# Patient Record
Sex: Male | Born: 1965 | Race: White | Hispanic: No | Marital: Married | Smoking: Never smoker
Health system: Southern US, Community
[De-identification: ages and names within clinical notes are randomized; demographics above are authoritative.]

## PROBLEM LIST (undated history)

## (undated) DIAGNOSIS — I10 Essential (primary) hypertension: Secondary | ICD-10-CM

## (undated) DIAGNOSIS — N200 Calculus of kidney: Secondary | ICD-10-CM

## (undated) DIAGNOSIS — E119 Type 2 diabetes mellitus without complications: Secondary | ICD-10-CM

## (undated) DIAGNOSIS — N201 Calculus of ureter: Secondary | ICD-10-CM

## (undated) HISTORY — DX: Calculus of ureter: N20.1

## (undated) HISTORY — DX: Essential (primary) hypertension: I10

## (undated) HISTORY — DX: Type 2 diabetes mellitus without complications: E11.9

---

## 2015-06-22 DIAGNOSIS — E119 Type 2 diabetes mellitus without complications: Secondary | ICD-10-CM | POA: Insufficient documentation

## 2015-06-22 HISTORY — DX: Type 2 diabetes mellitus without complications: E11.9

## 2015-06-24 ENCOUNTER — Ambulatory Visit
Admission: EM | Admit: 2015-06-24 | Discharge: 2015-06-26 | Disposition: A | Discharge status: Home / Self Care | Payer: Federal, State, Local not specified - PPO | Attending: Emergency Medicine | Admitting: Emergency Medicine

## 2015-06-24 ENCOUNTER — Encounter: Payer: Self-pay | Admitting: Emergency Medicine

## 2015-06-24 DIAGNOSIS — Z87442 Personal history of urinary calculi: Secondary | ICD-10-CM | POA: Diagnosis not present

## 2015-06-24 DIAGNOSIS — N201 Calculus of ureter: Secondary | ICD-10-CM | POA: Diagnosis present

## 2015-06-24 DIAGNOSIS — R109 Unspecified abdominal pain: Secondary | ICD-10-CM

## 2015-06-24 DIAGNOSIS — R110 Nausea: Secondary | ICD-10-CM | POA: Insufficient documentation

## 2015-06-24 DIAGNOSIS — D72829 Elevated white blood cell count, unspecified: Secondary | ICD-10-CM | POA: Insufficient documentation

## 2015-06-24 DIAGNOSIS — N132 Hydronephrosis with renal and ureteral calculous obstruction: Secondary | ICD-10-CM | POA: Diagnosis not present

## 2015-06-24 DIAGNOSIS — N200 Calculus of kidney: Secondary | ICD-10-CM | POA: Diagnosis present

## 2015-06-24 DIAGNOSIS — E119 Type 2 diabetes mellitus without complications: Secondary | ICD-10-CM | POA: Diagnosis not present

## 2015-06-24 DIAGNOSIS — R161 Splenomegaly, not elsewhere classified: Secondary | ICD-10-CM | POA: Insufficient documentation

## 2015-06-24 HISTORY — DX: Type 2 diabetes mellitus without complications: E11.9

## 2015-06-24 HISTORY — DX: Calculus of kidney: N20.0

## 2015-06-24 MED ORDER — ONDANSETRON HCL 4 MG/2ML IJ SOLN
INTRAMUSCULAR | Status: AC
  Administered 2015-06-24: 4 mg via INTRAVENOUS
  Filled 2015-06-24: qty 2

## 2015-06-24 MED ORDER — MORPHINE SULFATE (PF) 4 MG/ML IV SOLN
INTRAVENOUS | Status: AC
  Administered 2015-06-24: 4 mg via INTRAVENOUS
  Filled 2015-06-24: qty 1

## 2015-06-24 MED ORDER — ONDANSETRON HCL 4 MG/2ML IJ SOLN
4.0000 mg | Freq: Once | INTRAMUSCULAR | Status: AC
  Administered 2015-06-24: 4 mg via INTRAVENOUS

## 2015-06-24 MED ORDER — MORPHINE SULFATE (PF) 4 MG/ML IV SOLN
4.0000 mg | Freq: Once | INTRAVENOUS | Status: AC
  Administered 2015-06-24: 4 mg via INTRAVENOUS

## 2015-06-24 MED ORDER — SODIUM CHLORIDE 0.9 % IV BOLUS (SEPSIS)
1000.0000 mL | Freq: Once | INTRAVENOUS | Status: AC
  Administered 2015-06-24: 1000 mL via INTRAVENOUS

## 2015-06-24 NOTE — ED Notes (Signed)
Patient ambulatory to triage with steady gait, without difficulty or distress noted; pt reports right flank pain today radiating into right side abd and testicles accomp by nausea; st hx kidney stones; seen by PCP and UA was WNL

## 2015-06-24 NOTE — ED Provider Notes (Signed)
[facility] Emergency Department Provider Note  ____________________________________________  Time seen: 11:45 PM  I have reviewed the triage vital signs and the nursing notes.   HISTORY  Chief Complaint Flank Pain     HPI Nicholas Bentley is a 49 y.o. male presents with 10 out of 10 right flank pain that radiates to the right lower quadrant and groin acute onset yesterday which subsequently abated however return tonight approximately one hour ago. Patient states that he was seen by his primary care provider a urinalysis was performed yesterday which revealed normal results. Patient denies any hematuria no dysuria. However does admit to vomiting and nausea. Of note patient has a history of 2 previous kidney stones.     Past Medical History  Diagnosis Date  . Diabetes mellitus without complication   . Kidney stone     There are no active problems to display for this patient.   History reviewed. No pertinent past surgical history.  Current Outpatient Rx  Name  Route  Sig  Dispense  Refill  . metFORMIN (GLUCOPHAGE-XR) 750 MG 24 hr tablet   Oral   Take 750 mg by mouth daily with breakfast.           Allergies Review of patient's allergies indicates no known allergies.  No family history on file.  Social History Social History  Substance Use Topics  . Smoking status: Never Smoker   . Smokeless tobacco: None  . Alcohol Use: No    Review of Systems  Constitutional: Negative for fever. Eyes: Negative for visual changes. ENT: Negative for sore throat. Cardiovascular: Negative for chest pain. Respiratory: Negative for shortness of breath. Gastrointestinal: Positive for right flank pain and, vomiting Genitourinary: Negative for dysuria. Musculoskeletal: Negative for back pain. Skin: Negative for rash. Neurological: Negative for headaches, focal weakness or numbness.   10-point ROS otherwise  negative.  ____________________________________________   PHYSICAL EXAM:  VITAL SIGNS: ED Triage Vitals  Enc Vitals Group     BP 06/24/15 2310 154/73 mmHg     Pulse Rate 06/24/15 2310 104     Resp 06/24/15 2310 18     Temp 06/24/15 2310 97.9 F (36.6 C)     Temp Source 06/24/15 2310 Oral     SpO2 06/24/15 2310 100 %     Weight 06/24/15 2310 187 lb (84.823 kg)     Height 06/24/15 2310 5\' 10"  (1.778 m)     Head Cir --      Peak Flow --      Pain Score 06/24/15 2311 7     Pain Loc --      Pain Edu? --      Excl. in [place]? --      Constitutional: Alert and oriented. Apparent discomfort Eyes: Conjunctivae are normal. PERRL. Normal extraocular movements. ENT   Head: Normocephalic and atraumatic.   Nose: No congestion/rhinnorhea.   Mouth/Throat: Mucous membranes are moist.   Neck: No stridor. Cardiovascular: Normal rate, regular rhythm. Normal and symmetric distal pulses are present in all extremities. No murmurs, rubs, or gallops. Respiratory: Normal respiratory effort without tachypnea nor retractions. Breath sounds are clear and equal bilaterally. No wheezes/rales/rhonchi. Gastrointestinal: Soft and nontender. No distention. There is no CVA tenderness. Genitourinary: deferred Musculoskeletal: Nontender with normal range of motion in all extremities. No joint effusions.  No lower extremity tenderness nor edema. Neurologic:  Normal speech and language. No gross focal neurologic deficits are appreciated. Speech is normal.  Skin:  Skin is warm, dry and  intact. No rash noted. Psychiatric: Mood and affect are normal. Speech and behavior are normal. Patient exhibits appropriate insight and judgment.  ____________________________________________    LABS (pertinent positives/negatives)  Labs Reviewed  CBC - Abnormal; Notable for the following:    WBC 21.8 (*)    All other components within normal limits  COMPREHENSIVE METABOLIC PANEL - Abnormal; Notable for the  following:    Chloride 100 (*)    Glucose, Bld 154 (*)    BUN 25 (*)    Creatinine, Ser 1.41 (*)    AST 14 (*)    ALT 14 (*)    GFR calc non Af Amer 57 (*)    All other components within normal limits  URINALYSIS COMPLETEWITH MICROSCOPIC ([facility] ONLY) - Abnormal; Notable for the following:    Color, Urine YELLOW (*)    APPearance CLEAR (*)    Ketones, ur 1+ (*)    Hgb urine dipstick 2+ (*)    All other components within normal limits      RADIOLOGY CT Abdomen Pelvis W Contrast (Final result) Result time: 06/25/15 01:41:05   Final result by Rad Results In Interface (06/25/15 01:41:05)   Narrative:   CLINICAL DATA: Left lower quadrant pain for 2 weeks. Nausea and vomiting.  EXAM: CT ABDOMEN AND PELVIS WITH CONTRAST  TECHNIQUE: Multidetector CT imaging of the abdomen and pelvis was performed using the standard protocol following bolus administration of intravenous contrast.  CONTRAST: 111mL OMNIPAQUE IOHEXOL 300 MG/ML SOLN  COMPARISON: None.  FINDINGS: Lower chest: The included lung bases are clear.  Liver: No focal lesion.  Hepatobiliary: Clips in the gallbladder fossa postcholecystectomy. No biliary dilatation.  Pancreas: Normal.  Spleen: Normal.  Adrenal glands: No nodule.  Kidneys: Symmetric renal enhancement and excretion. No hydronephrosis. No focal renal abnormality.  Stomach/Bowel: Stomach is distended with enteric contrast. There are no dilated or thickened small bowel loops. Small volume of stool throughout the colon without colonic wall thickening. No significant diverticular disease or diverticulitis. The appendix is normal.  Vascular/Lymphatic: No retroperitoneal adenopathy. Abdominal aorta is normal in caliber. Mild atherosclerosis of the abdominal aorta and its branches without aneurysm.  Reproductive: The uterus is surgically absent. No adnexal mass.  Bladder: Near completely decompressed.  Other: No free air, free fluid, or  intra-abdominal fluid collection.  Musculoskeletal: There are no acute or suspicious osseous abnormalities.  IMPRESSION: No acute abnormality in the abdomen/pelvis. No diverticulosis or diverticulitis.   Electronically Signed By: Jeb Levering M.D. On: 06/25/2015 01:41         INITIAL IMPRESSION / ASSESSMENT AND PLAN / ED COURSE  Pertinent labs & imaging results that were available during my care of the patient were reviewed by me and considered in my medical decision making (see chart for details).  Patient discussed with Dr. Alyson Ingles following reviewing patient CT scan which revealed a 6 x 7 mm kidney stone. Leukocytosis white count of 21.8 and history of fever at home per patient's wife at bedside.  ____________________________________________   FINAL CLINICAL IMPRESSION(S) / ED DIAGNOSES  Final diagnoses:  Right flank pain  Right kidney stone      Gregor Hams, MD 06/25/15 [phone]

## 2015-06-25 ENCOUNTER — Encounter
Admission: EM | Disposition: A | Discharge status: Home / Self Care | Payer: Self-pay | Source: Home / Self Care | Attending: Emergency Medicine

## 2015-06-25 ENCOUNTER — Emergency Department: Payer: Federal, State, Local not specified - PPO | Admitting: Anesthesiology

## 2015-06-25 ENCOUNTER — Encounter: Payer: Self-pay | Admitting: Anesthesiology

## 2015-06-25 ENCOUNTER — Emergency Department: Payer: Federal, State, Local not specified - PPO

## 2015-06-25 DIAGNOSIS — N201 Calculus of ureter: Secondary | ICD-10-CM | POA: Diagnosis present

## 2015-06-25 HISTORY — DX: Calculus of ureter: N20.1

## 2015-06-25 HISTORY — PX: CYSTOSCOPY W/ URETERAL STENT PLACEMENT: SHX1429

## 2015-06-25 HISTORY — PX: CYSTOSCOPY W/ RETROGRADES: SHX1426

## 2015-06-25 LAB — BASIC METABOLIC PANEL
ANION GAP: 7 (ref 5–15)
BUN: 22 mg/dL — ABNORMAL HIGH (ref 6–20)
CALCIUM: 8.7 mg/dL — AB (ref 8.9–10.3)
CO2: 30 mmol/L (ref 22–32)
Chloride: 104 mmol/L (ref 101–111)
Creatinine, Ser: 1.24 mg/dL (ref 0.61–1.24)
GFR calc Af Amer: >60 mL/min (ref >60)
GLUCOSE: 149 mg/dL — AB (ref 65–99)
Potassium: 4 mmol/L (ref 3.5–5.1)
Sodium: 141 mmol/L (ref 135–145)

## 2015-06-25 LAB — COMPREHENSIVE METABOLIC PANEL
ALT: 14 U/L — ABNORMAL LOW (ref 17–63)
AST: 14 U/L — AB (ref 15–41)
Albumin: 4.1 g/dL (ref 3.5–5.0)
Alkaline Phosphatase: 51 U/L (ref 38–126)
Anion gap: 8 (ref 5–15)
BILIRUBIN TOTAL: 1.2 mg/dL (ref 0.3–1.2)
BUN: 25 mg/dL — AB (ref 6–20)
CALCIUM: 8.9 mg/dL (ref 8.9–10.3)
CO2: 27 mmol/L (ref 22–32)
Chloride: 100 mmol/L — ABNORMAL LOW (ref 101–111)
Creatinine, Ser: 1.41 mg/dL — ABNORMAL HIGH (ref 0.61–1.24)
GFR calc Af Amer: >60 mL/min (ref >60)
GFR, EST NON AFRICAN AMERICAN: 57 mL/min — AB (ref >60)
Glucose, Bld: 154 mg/dL — ABNORMAL HIGH (ref 65–99)
POTASSIUM: 4.2 mmol/L (ref 3.5–5.1)
Sodium: 135 mmol/L (ref 135–145)
TOTAL PROTEIN: 7.2 g/dL (ref 6.5–8.1)

## 2015-06-25 LAB — CBC
HCT: 38.4 % — ABNORMAL LOW (ref 40.0–52.0)
HEMATOCRIT: 41.4 % (ref 40.0–52.0)
Hemoglobin: 12.9 g/dL — ABNORMAL LOW (ref 13.0–18.0)
Hemoglobin: 14 g/dL (ref 13.0–18.0)
MCH: 28.3 pg (ref 26.0–34.0)
MCH: 28.5 pg (ref 26.0–34.0)
MCHC: 33.6 g/dL (ref 32.0–36.0)
MCHC: 33.9 g/dL (ref 32.0–36.0)
MCV: 84 fL (ref 80.0–100.0)
MCV: 84.1 fL (ref 80.0–100.0)
PLATELETS: 150 10*3/uL (ref 150–440)
Platelets: 167 10*3/uL (ref 150–440)
RBC: 4.57 MIL/uL (ref 4.40–5.90)
RBC: 4.92 MIL/uL (ref 4.40–5.90)
RDW: 13.1 % (ref 11.5–14.5)
RDW: 13.4 % (ref 11.5–14.5)
WBC: 18.4 10*3/uL — AB (ref 3.8–10.6)
WBC: 21.8 10*3/uL — AB (ref 3.8–10.6)

## 2015-06-25 LAB — URINALYSIS COMPLETE WITH MICROSCOPIC (ARMC ONLY)
BILIRUBIN URINE: NEGATIVE
Bacteria, UA: NONE SEEN
GLUCOSE, UA: NEGATIVE mg/dL
Leukocytes, UA: NEGATIVE
Nitrite: NEGATIVE
Protein, ur: NEGATIVE mg/dL
Specific Gravity, Urine: 1.016 (ref 1.005–1.030)
Squamous Epithelial / LPF: NONE SEEN
pH: 5 (ref 5.0–8.0)

## 2015-06-25 LAB — GLUCOSE, CAPILLARY
GLUCOSE-CAPILLARY: 116 mg/dL — AB (ref 65–99)
GLUCOSE-CAPILLARY: 108 mg/dL — AB (ref 65–99)
Glucose-Capillary: 150 mg/dL — ABNORMAL HIGH (ref 65–99)
Glucose-Capillary: 103 mg/dL — ABNORMAL HIGH (ref 65–99)

## 2015-06-25 SURGERY — CYSTOSCOPY, FLEXIBLE, WITH STENT REPLACEMENT
Anesthesia: General | Laterality: Right | Wound class: Clean Contaminated

## 2015-06-25 MED ORDER — FENTANYL CITRATE (PF) 100 MCG/2ML IJ SOLN: 25.0000 ug | INTRAMUSCULAR | Status: DC | PRN

## 2015-06-25 MED ORDER — SULFAMETHOXAZOLE-TRIMETHOPRIM 800-160 MG PO TABS: 1.0000 | ORAL_TABLET | Freq: Two times a day (BID) | ORAL | Status: DC

## 2015-06-25 MED ORDER — BISACODYL 10 MG RE SUPP: 10.0000 mg | Freq: Every day | RECTAL | Status: DC | PRN

## 2015-06-25 MED ORDER — SENNA 8.6 MG PO TABS
1.0000 | ORAL_TABLET | Freq: Two times a day (BID) | ORAL | Status: DC
  Filled 2015-06-25 (×2): qty 1

## 2015-06-25 MED ORDER — DEXTROSE 5 % IV SOLN
1.0000 g | Freq: Every day | INTRAVENOUS | Status: DC
  Administered 2015-06-25 – 2015-06-26 (×2): 1 g via INTRAVENOUS
  Filled 2015-06-25 (×3): qty 10

## 2015-06-25 MED ORDER — FENTANYL CITRATE (PF) 100 MCG/2ML IJ SOLN
INTRAMUSCULAR | Status: DC | PRN
  Administered 2015-06-25: 50 ug via INTRAVENOUS

## 2015-06-25 MED ORDER — OXYCODONE-ACETAMINOPHEN 5-325 MG PO TABS: 1.0000 | ORAL_TABLET | ORAL | Status: DC | PRN

## 2015-06-25 MED ORDER — ZOLPIDEM TARTRATE 5 MG PO TABS: 5.0000 mg | ORAL_TABLET | Freq: Every evening | ORAL | Status: DC | PRN

## 2015-06-25 MED ORDER — ACETAMINOPHEN 325 MG PO TABS: 650.0000 mg | ORAL_TABLET | ORAL | Status: DC | PRN

## 2015-06-25 MED ORDER — PROPOFOL 10 MG/ML IV BOLUS
INTRAVENOUS | Status: DC | PRN
  Administered 2015-06-25: 180 mg via INTRAVENOUS

## 2015-06-25 MED ORDER — BELLADONNA ALKALOIDS-OPIUM 16.2-60 MG RE SUPP: 1.0000 | Freq: Four times a day (QID) | RECTAL | Status: DC | PRN

## 2015-06-25 MED ORDER — MIDAZOLAM HCL 2 MG/2ML IJ SOLN
INTRAMUSCULAR | Status: DC | PRN
  Administered 2015-06-25: 1 mg via INTRAVENOUS

## 2015-06-25 MED ORDER — DIPHENHYDRAMINE HCL 12.5 MG/5ML PO ELIX: 12.5000 mg | ORAL_SOLUTION | Freq: Four times a day (QID) | ORAL | Status: DC | PRN

## 2015-06-25 MED ORDER — CEFAZOLIN SODIUM-DEXTROSE 2-3 GM-% IV SOLR
INTRAVENOUS | Status: DC | PRN
  Administered 2015-06-25: 2 g via INTRAVENOUS

## 2015-06-25 MED ORDER — HYDROMORPHONE HCL 1 MG/ML IJ SOLN
1.0000 mg | Freq: Once | INTRAMUSCULAR | Status: AC
  Administered 2015-06-25: 1 mg via INTRAVENOUS
  Filled 2015-06-25: qty 1

## 2015-06-25 MED ORDER — INSULIN ASPART 100 UNIT/ML ~~LOC~~ SOLN
0.0000 [IU] | Freq: Three times a day (TID) | SUBCUTANEOUS | Status: DC
  Administered 2015-06-25: 2 [IU] via SUBCUTANEOUS
  Filled 2015-06-25: qty 2

## 2015-06-25 MED ORDER — ONDANSETRON HCL 4 MG/2ML IJ SOLN: 4.0000 mg | INTRAMUSCULAR | Status: DC | PRN

## 2015-06-25 MED ORDER — SODIUM CHLORIDE 0.9 % IV SOLN
INTRAVENOUS | Status: DC
  Administered 2015-06-25: 10:00:00 via INTRAVENOUS

## 2015-06-25 MED ORDER — SODIUM CHLORIDE 0.9 % IV SOLN
INTRAVENOUS | Status: DC | PRN
  Administered 2015-06-25: 04:00:00 via INTRAVENOUS

## 2015-06-25 MED ORDER — HYDROMORPHONE HCL 1 MG/ML IJ SOLN: 0.5000 mg | INTRAMUSCULAR | Status: DC | PRN

## 2015-06-25 MED ORDER — SUCCINYLCHOLINE CHLORIDE 20 MG/ML IJ SOLN
INTRAMUSCULAR | Status: DC | PRN
  Administered 2015-06-25: 100 mg via INTRAVENOUS

## 2015-06-25 MED ORDER — MAGNESIUM CITRATE PO SOLN
1.0000 | Freq: Once | ORAL | Status: DC | PRN
  Filled 2015-06-25: qty 296

## 2015-06-25 MED ORDER — TAMSULOSIN HCL 0.4 MG PO CAPS: 0.4000 mg | ORAL_CAPSULE | Freq: Every day | ORAL | Status: DC

## 2015-06-25 MED ORDER — ONDANSETRON HCL 4 MG/2ML IJ SOLN
INTRAMUSCULAR | Status: DC | PRN
  Administered 2015-06-25: 4 mg via INTRAVENOUS

## 2015-06-25 MED ORDER — POLYETHYLENE GLYCOL 3350 17 G PO PACK: 17.0000 g | PACK | Freq: Every day | ORAL | Status: DC | PRN

## 2015-06-25 MED ORDER — DIPHENHYDRAMINE HCL 50 MG/ML IJ SOLN: 12.5000 mg | Freq: Four times a day (QID) | INTRAMUSCULAR | Status: DC | PRN

## 2015-06-25 MED ORDER — ONDANSETRON HCL 4 MG/2ML IJ SOLN: 4.0000 mg | Freq: Once | INTRAMUSCULAR | Status: DC | PRN

## 2015-06-25 MED ORDER — LIDOCAINE HCL (CARDIAC) 20 MG/ML IV SOLN
INTRAVENOUS | Status: DC | PRN
  Administered 2015-06-25: 30 mg via INTRAVENOUS

## 2015-06-25 SURGICAL SUPPLY — 24 items
BAG DRAIN CYSTO-URO LG1000N (MISCELLANEOUS) ×4 IMPLANT
CATH FOLEY 2WAY SIL 16X30 (CATHETERS) ×4 IMPLANT
CATH URETL 5X70 OPEN END (CATHETERS) ×4 IMPLANT
CONRAY 43 FOR UROLOGY 50M (MISCELLANEOUS) ×4 IMPLANT
GLOVE BIO SURGEON STRL SZ7 (GLOVE) ×4 IMPLANT
GLOVE BIO SURGEON STRL SZ8 (GLOVE) ×4 IMPLANT
GOWN STRL REUS W/ TWL LRG LVL3 (GOWN DISPOSABLE) ×2 IMPLANT
GOWN STRL REUS W/TWL LRG LVL3 (GOWN DISPOSABLE) ×2
GUIDEWIRE STR ZIPWIRE 035X150 (MISCELLANEOUS) ×4 IMPLANT
KIT RM TURNOVER CYSTO AR (KITS) ×4 IMPLANT
PACK CYSTO AR (MISCELLANEOUS) ×4 IMPLANT
PREP PVP WINGED SPONGE (MISCELLANEOUS) ×4 IMPLANT
SENSORWIRE 0.038 NOT ANGLED (WIRE) ×4
SET CYSTO W/LG BORE CLAMP LF (SET/KITS/TRAYS/PACK) ×4 IMPLANT
SOL .9 NS 3000ML IRR  AL (IV SOLUTION) ×2
SOL .9 NS 3000ML IRR UROMATIC (IV SOLUTION) ×2 IMPLANT
STENT URET 6FRX24 CONTOUR (STENTS) IMPLANT
STENT URET 6FRX26 CONTOUR (STENTS) ×4 IMPLANT
SURGILUBE 2OZ TUBE FLIPTOP (MISCELLANEOUS) ×4 IMPLANT
SYR 30ML LL (SYRINGE) ×4 IMPLANT
SYRINGE IRR TOOMEY STRL 70CC (SYRINGE) ×4 IMPLANT
TRAP SPECIMEN MUCOUS 40CC (MISCELLANEOUS) ×4 IMPLANT
WATER STERILE IRR 1000ML POUR (IV SOLUTION) ×4 IMPLANT
WIRE SENSOR 0.038 NOT ANGLED (WIRE) ×2 IMPLANT

## 2015-06-25 NOTE — Consult Note (Signed)
Urology Consult  Referring physician: Dr. Owens Shark  Reason for referral: right ureteral stone  Chief Complaint: right flank pain  History of Present Illness: Nicholas Bentley is a 49yo with a hx of nephrolithiasis and DMII who presents with a 1 day hx of severe sharp, intermittent, non-radiating right flank pain. He has associated nausea/vomiting. CT scan shows a 84m rigth mid ureteral stone with moderate hydronephrosis, perinephric stranding and an enlarge kidney concerning for pyelonephritis. He has bilateral lower pole small renal calculi. WBC count is 21.8. UA and micro are positive for blood and leukocytes. He had a fever of 100.4 at home, but is afebrile here.   Past Medical History  Diagnosis Date  . Diabetes mellitus without complication   . Kidney stone    History reviewed. No pertinent past surgical history.  Medications: I have reviewed the patient's current medications. Allergies: No Known Allergies  No family history on file. Social History:  reports that he has never smoked. He does not have any smokeless tobacco history on file. He reports that he does not drink alcohol. His drug history is not on file.  Review of Systems  Gastrointestinal: Positive for nausea and vomiting.  Genitourinary: Positive for dysuria, urgency, frequency and flank pain.  All other systems reviewed and are negative.   Physical Exam:  Vital signs in last 24 hours: Temp:  [97.9 F (36.6 C)] 97.9 F (36.6 C) (09/14 2310) Pulse Rate:  [89-104] 89 (09/15 0130) Resp:  [18] 18 (09/14 2310) BP: (154)/(73-89) 154/89 mmHg (09/15 0130) SpO2:  [95 %-100 %] 95 % (09/15 0130) Weight:  [84.823 kg (187 lb)] 84.823 kg (187 lb) (09/14 2310) Physical Exam  Constitutional: He is oriented to person, place, and time. He appears well-developed and well-nourished.  HENT:  Head: Normocephalic and atraumatic.  Eyes: EOM are normal. Pupils are equal, round, and reactive to light.  Neck: Normal range of motion. No  thyromegaly present.  Cardiovascular: Normal rate and regular rhythm.   Respiratory: Effort normal. No respiratory distress.  GI: Soft. He exhibits no distension.  Genitourinary: Penis normal.  Musculoskeletal: Normal range of motion.  Neurological: He is alert and oriented to person, place, and time.  Skin: Skin is warm and dry.  Psychiatric: He has a normal mood and affect. His behavior is normal. Judgment and thought content normal.    Laboratory Data:  Results for orders placed or performed during the hospital encounter of 06/24/15 (from the past 72 hour(s))  CBC     Status: Abnormal   Collection Time: 06/25/15 12:28 AM  Result Value Ref Range   WBC 21.8 (H) 3.8 - 10.6 K/uL   RBC 4.92 4.40 - 5.90 MIL/uL   Hemoglobin 14.0 13.0 - 18.0 g/dL   HCT 41.4 40.0 - 52.0 %   MCV 84.1 80.0 - 100.0 fL   MCH 28.5 26.0 - 34.0 pg   MCHC 33.9 32.0 - 36.0 g/dL   RDW 13.1 11.5 - 14.5 %   Platelets 167 150 - 440 K/uL  Comprehensive metabolic panel     Status: Abnormal   Collection Time: 06/25/15 12:28 AM  Result Value Ref Range   Sodium 135 135 - 145 mmol/Bentley   Potassium 4.2 3.5 - 5.1 mmol/Bentley   Chloride 100 (Bentley) 101 - 111 mmol/Bentley   CO2 27 22 - 32 mmol/Bentley   Glucose, Bld 154 (H) 65 - 99 mg/dL   BUN 25 (H) 6 - 20 mg/dL   Creatinine, Ser 1.41 (H) 0.61 - 1.24 mg/dL  Calcium 8.9 8.9 - 10.3 mg/dL   Total Protein 7.2 6.5 - 8.1 g/dL   Albumin 4.1 3.5 - 5.0 g/dL   AST 14 (Bentley) 15 - 41 U/Bentley   ALT 14 (Bentley) 17 - 63 U/Bentley   Alkaline Phosphatase 51 38 - 126 U/Bentley   Total Bilirubin 1.2 0.3 - 1.2 mg/dL   GFR calc non Af Amer 57 (Bentley) >60 mL/min   GFR calc Af Amer >60 >60 mL/min    Comment: (NOTE) The eGFR has been calculated using the CKD EPI equation. This calculation has not been validated in all clinical situations. eGFR's persistently <60 mL/min signify possible Chronic Kidney Disease.    Anion gap 8 5 - 15  Urinalysis complete, with microscopic ([facility] only)     Status: Abnormal   Collection Time: 06/25/15  12:28 AM  Result Value Ref Range   Color, Urine YELLOW (A) YELLOW   APPearance CLEAR (A) CLEAR   Glucose, UA NEGATIVE NEGATIVE mg/dL   Bilirubin Urine NEGATIVE NEGATIVE   Ketones, ur 1+ (A) NEGATIVE mg/dL   Specific Gravity, Urine 1.016 1.005 - 1.030   Hgb urine dipstick 2+ (A) NEGATIVE   pH 5.0 5.0 - 8.0   Protein, ur NEGATIVE NEGATIVE mg/dL   Nitrite NEGATIVE NEGATIVE   Leukocytes, UA NEGATIVE NEGATIVE   RBC / HPF 6-30 0 - 5 RBC/hpf   WBC, UA 0-5 0 - 5 WBC/hpf   Bacteria, UA NONE SEEN NONE SEEN   Squamous Epithelial / LPF NONE SEEN NONE SEEN   Mucous PRESENT    No results found for this or any previous visit (from the past 240 hour(s)). Creatinine:  Recent Labs  06/25/15 0028  CREATININE 1.41*   Baseline Creatinine: unkown  Impression/Assessment:  49yo with a right ureteral calculus and leukocytosis  Plan:  The risks/benefits/alternatives to right ureteral stent placement was explained to the patient and he understands and wishes to proceed with surgery. Pt will be taken urgently to OR for stent placement and then will be admitted for observation following stent placement.   Nicholas Bentley 06/25/2015, 2:30 AM

## 2015-06-25 NOTE — Brief Op Note (Signed)
06/24/2015 - 06/25/2015  4:46 AM  PATIENT:  Nicholas Bentley  49 y.o. male  PRE-OPERATIVE DIAGNOSIS:  stones  POST-OPERATIVE DIAGNOSIS:  right uretral stone obstruction  PROCEDURE:  Procedure(s): CYSTOSCOPY WITH STENT PLACEMENT (Right) CYSTOSCOPY WITH RETROGRADE PYELOGRAM  SURGEON:  Surgeon(s) and Role:    * Cleon Gustin, MD - Primary  PHYSICIAN ASSISTANT:   ASSISTANTS: none   ANESTHESIA:   general  EBL:     BLOOD ADMINISTERED:none  DRAINS: Urinary Catheter (Foley) , right 6x26 JJ ureteral stent without tether  LOCAL MEDICATIONS USED:  NONE  SPECIMEN:  Source of Specimen:  right renal pelvis  DISPOSITION OF SPECIMEN:  N/A  COUNTS:  YES  TOURNIQUET:  * No tourniquets in log *  DICTATION: .Note written in EPIC  PLAN OF CARE: Admit for overnight observation  PATIENT DISPOSITION:  PACU - hemodynamically stable.   Delay start of Pharmacological VTE agent (>24hrs) due to surgical blood loss or risk of bleeding: not applicable

## 2015-06-25 NOTE — Progress Notes (Signed)
No events since this morning Doing well No f/c/n/v  Filed Vitals:   06/25/15 0612 06/25/15 0643 06/25/15 0716 06/25/15 1342  BP: 139/78 135/76 123/75 135/75  Pulse: 96 96 88 92  Temp: 97.7 F (36.5 C) 98.2 F (36.8 C) 97.8 F (36.6 C) 98.8 F (37.1 C)  TempSrc: Oral Oral Oral Oral  Resp: 16 18 18 18   Height:      Weight: 183 lb 15.9 oz (83.458 kg)     SpO2: 95% 97% 99% 99%   NAD Soft nt nd No foley  POD 0 cysto, r stent. doing well -foley out -diet -abx -repeat cbc -home in am if doing well

## 2015-06-25 NOTE — Op Note (Signed)
.  Preoperative diagnosis: right UPJ stone, leukocytosis  Postoperative diagnosis: Same  Procedure: 1 cystoscopy 2. right retrograde pyelography 3.  Intraoperative fluoroscopy, under one hour, with interpretation 4. right 6 x 26 JJ stent placement  Attending: Nicolette Bang  Anesthesia: General  Estimated blood loss: None  Drains: Right 6 x 26 JJ ureteral stent without tether, 16 French foley catheter  Specimens: right renal pelvis urine for culture  Antibiotics: ancef  Findings:right mid ureteral stone stone. Moderate hydronephrosis. No masses/lesions in the bladder. Ureteral orifices in normal anatomic location.  Indications: Patient is a 49 year old male with a history of right renal stone and concern for sepsis.  After discussing treatment options, they decided proceed with right stent placement.  Procedure her in detail: The patient was brought to the operating room and a brief timeout was done to ensure correct patient, correct procedure, correct site.  General anesthesia was administered patient was placed in dorsal lithotomy position.  Their genitalia was then prepped and draped in usual sterile fashion.  A rigid 75 French cystoscope was passed in the urethra and the bladder.  Bladder was inspected free masses or lesions.  the ureteral orifices were in the normal orthotopic locations.  a 6 french ureteral catheter was then instilled into the right ureteral orifice. We advanced the catheter past the stone with the aid of a sensor wire. We then obtained a urine specimen and sent it for culture.  a gentle retrograde was obtained and findings noted above.  we then placed a sensor wire through the ureteral catheter and advanced up to the renal pelvis.    We then placed a 6 x 26 double-j ureteral stent over the original zip wire.  We then removed the wire and good coil was noted in the the renal pelvis under fluoroscopy and the bladder under direct vision.  A foley catheter was then  placed. the bladder was then drained and this concluded the procedure which was well tolerated by patient.  Complications: None  Condition: Stable, extubated, transferred to PACU  Plan: Patient is to be admitted for IV antibiotics. He will have his stone extraction in 2 weeks.

## 2015-06-25 NOTE — Anesthesia Procedure Notes (Signed)
Procedure Name: Intubation Date/Time: 06/25/2015 4:22 AM Performed by: Lendon Colonel Pre-anesthesia Checklist: Patient identified, Emergency Drugs available, Suction available, Patient being monitored and Timeout performed Patient Re-evaluated:Patient Re-evaluated prior to inductionOxygen Delivery Method: Circle system utilized Preoxygenation: Pre-oxygenation with 100% oxygen Intubation Type: IV induction, Cricoid Pressure applied and Rapid sequence Laryngoscope Size: Miller and 2 Grade View: Grade I Tube type: Oral Number of attempts: 1 Airway Equipment and Method: Stylet Placement Confirmation: ETT inserted through vocal cords under direct vision,  positive ETCO2 and breath sounds checked- equal and bilateral Secured at: 21 cm Tube secured with: Tape Dental Injury: Teeth and Oropharynx as per pre-operative assessment

## 2015-06-25 NOTE — Transfer of Care (Signed)
Immediate Anesthesia Transfer of Care Note  Patient: [name], retail  Procedure(s) Performed: Procedure(s): CYSTOSCOPY WITH STENT PLACEMENT (Right) CYSTOSCOPY WITH RETROGRADE PYELOGRAM  Patient Location: PACU  Anesthesia Type:General  Level of Consciousness: awake, alert , oriented and patient cooperative  Airway & Oxygen Therapy: Patient Spontanous Breathing and Patient connected to face mask oxygen  Post-op Assessment: Report given to RN and Post -op Vital signs reviewed and stable  Post vital signs: Reviewed and stable  Last Vitals:  Filed Vitals:   06/25/15 0314  BP: 148/89  Pulse: 89  Temp:   Resp: 18    Complications: No apparent anesthesia complications

## 2015-06-25 NOTE — Anesthesia Preprocedure Evaluation (Signed)
Anesthesia Evaluation  Patient identified by MRN, date of birth, ID band Patient awake    Reviewed: Allergy & Precautions, NPO status , Patient's Chart, lab work & pertinent test results  Airway Mallampati: II  TM Distance: >3 FB Neck ROM: Full    Dental no notable dental hx.    Pulmonary neg pulmonary ROS,    Pulmonary exam normal breath sounds clear to auscultation       Cardiovascular negative cardio ROS Normal cardiovascular exam     Neuro/Psych negative neurological ROS  negative psych ROS   GI/Hepatic negative GI ROS,   Endo/Other  diabetes, Type 2, Oral Hypoglycemic Agents  Renal/GU Kidney stones  negative genitourinary   Musculoskeletal negative musculoskeletal ROS (+)   Abdominal Normal abdominal exam  (+)   Peds negative pediatric ROS (+)  Hematology negative hematology ROS (+)   Anesthesia Other Findings   Reproductive/Obstetrics                             Anesthesia Physical Anesthesia Plan  ASA: II and emergent  Anesthesia Plan: General   Post-op Pain Management:    Induction: Intravenous, Rapid sequence and Cricoid pressure planned  Airway Management Planned: Oral ETT  Additional Equipment:   Intra-op Plan:   Post-operative Plan: Extubation in OR  Informed Consent: I have reviewed the patients History and Physical, chart, labs and discussed the procedure including the risks, benefits and alternatives for the proposed anesthesia with the patient or authorized representative who has indicated his/her understanding and acceptance.   Dental advisory given  Plan Discussed with: CRNA and Surgeon  Anesthesia Plan Comments:         Anesthesia Quick Evaluation

## 2015-06-26 ENCOUNTER — Telehealth: Payer: Self-pay | Admitting: Radiology

## 2015-06-26 LAB — CBC
HEMATOCRIT: 37.3 % — AB (ref 40.0–52.0)
HEMOGLOBIN: 12.8 g/dL — AB (ref 13.0–18.0)
MCH: 28.6 pg (ref 26.0–34.0)
MCHC: 34.2 g/dL (ref 32.0–36.0)
MCV: 83.5 fL (ref 80.0–100.0)
Platelets: 160 10*3/uL (ref 150–440)
RBC: 4.47 MIL/uL (ref 4.40–5.90)
RDW: 12.9 % (ref 11.5–14.5)
WBC: 10 10*3/uL (ref 3.8–10.6)

## 2015-06-26 LAB — GLUCOSE, CAPILLARY: GLUCOSE-CAPILLARY: 119 mg/dL — AB (ref 65–99)

## 2015-06-26 NOTE — Discharge Instructions (Signed)
Patient may shower, activities unlimited may work should drink 2 quarts of water daily

## 2015-06-26 NOTE — Progress Notes (Signed)
Alert and oriented. Vss. No signs of acute distress. Voiding. No pain reported. Tolerating diet. Discharge instructions given. Patient verbalized understanding.

## 2015-06-26 NOTE — Telephone Encounter (Signed)
Pt notified of surgery scheduled 07/13/15. Pt to call day before for arrival time. Pt verbalized understanding. Pt asked to change pre-admit testing appt to a phone interview since he has recently had surgery.  Will call pt with new appt.

## 2015-06-26 NOTE — Discharge Summary (Signed)
Patient admitted with sepsis and a midureteral calculus. He was stented on his day of admission which was benign 15. Today and 916 not septic lungs are clear to auscultation heart sounds regular rate and rhythm abdomen soft no discomfort. He feels he can take Tylenol at home for discomfort. Dr. Rosie Fate placed a right ureteral stent in this patient. I reviewed his x-rays and he has a right thumb ureteral calculus at the level of about L3. It's not quite in his UPJ. The plan is to treat him with outpatient antibiotics in the form of Ceftin 250 mg twice a day until his stone is removed with ureteroscopy and laser lithotripsy in the near future. Probably wait about 10 days before operating him. I went through the surgery with the patient and his wife. I answered all her questions. His cultures have not come back yet. I'll review them in the office and if there is any need for change in his antibiotic medication I will do. Follow-up is in my office in about a week for preoperative orders.

## 2015-06-27 LAB — URINE CULTURE: CULTURE: NO GROWTH

## 2015-06-29 ENCOUNTER — Telehealth: Payer: Self-pay | Admitting: Radiology

## 2015-06-29 NOTE — Telephone Encounter (Signed)
Pt's wife called to clarify instructions.  Clarified that surgery is scheduled 07/13/15, pre-admit testing phone interview is scheduled for 07/07/15, pt should call on Sept 30 to verify arrival time for surgery. Verbalizes understanding.

## 2015-06-30 NOTE — Anesthesia Postprocedure Evaluation (Signed)
  Anesthesia Post-op Note  Patient: [name], retail  Procedure(s) Performed: Procedure(s): CYSTOSCOPY WITH STENT PLACEMENT (Right) CYSTOSCOPY WITH RETROGRADE PYELOGRAM  Anesthesia type:General  Patient location: PACU  Post pain: Pain level controlled  Post assessment: Post-op Vital signs reviewed, Patient's Cardiovascular Status Stable, Respiratory Function Stable, Patent Airway and No signs of Nausea or vomiting  Post vital signs: Reviewed and stable  Last Vitals:  Filed Vitals:   06/26/15 0458  BP: 146/83  Pulse: 85  Temp: 36.8 C  Resp: 18    Level of consciousness: awake, alert  and patient cooperative  Complications: No apparent anesthesia complications

## 2015-07-03 ENCOUNTER — Encounter: Payer: Self-pay | Admitting: Obstetrics and Gynecology

## 2015-07-03 ENCOUNTER — Ambulatory Visit (INDEPENDENT_AMBULATORY_CARE_PROVIDER_SITE_OTHER): Payer: Federal, State, Local not specified - PPO | Admitting: Obstetrics and Gynecology

## 2015-07-03 ENCOUNTER — Ambulatory Visit: Payer: Self-pay | Admitting: Obstetrics and Gynecology

## 2015-07-03 VITALS — BP 138/80 | HR 80 | Resp 16 | Ht 70.0 in | Wt 187.4 lb

## 2015-07-03 DIAGNOSIS — N200 Calculus of kidney: Secondary | ICD-10-CM | POA: Diagnosis not present

## 2015-07-03 LAB — URINALYSIS, COMPLETE
BILIRUBIN UA: NEGATIVE
Glucose, UA: NEGATIVE
KETONES UA: NEGATIVE
Nitrite, UA: NEGATIVE
SPEC GRAV UA: 1.025 (ref 1.005–1.030)
Urobilinogen, Ur: 0.2 mg/dL (ref 0.2–1.0)
pH, UA: 5.5 (ref 5.0–7.5)

## 2015-07-03 LAB — MICROSCOPIC EXAMINATION
RBC, UA: >30 /hpf — AB (ref 0–?)
Renal Epithel, UA: NONE SEEN /hpf

## 2015-07-03 NOTE — Progress Notes (Signed)
07/03/2015 11:40 AM   Marliss Czar Mar 01, 1966 [id number]  Referring provider: No referring provider defined for this encounter.  Chief Complaint  Patient presents with  . Pre-op Exam  . Nephrolithiasis    HPI: 49yo with a hx of nephrolithiasis and DMII who presented to the ED on  with a 1 day hx of severe sharp, intermittent, non-radiating right flank pain. He has associated nausea/vomiting. CT scan shows a 29mm rigth mid ureteral stone with moderate hydronephrosis, perinephric stranding and an enlarge kidney concerning for pyelonephritis. He has bilateral lower pole small renal calculi. He was found to have elevated WBCs and ultimately a left ureteral stent was placed by Dr. Alyson Ingles on 06/25/15. He was discharged on Bactrim pending urine culture results which ultimately showed no growth.   Patient presents today for preop appointment for definitive treatment of right ureteral stone.  These reports feeling much better since stent was placed. He denies any fevers.  PMH: Past Medical History  Diagnosis Date  . Diabetes mellitus without complication   . Kidney stone     Surgical History: Past Surgical History  Procedure Laterality Date  . Cystoscopy w/ ureteral stent placement Right 06/25/2015    Procedure: CYSTOSCOPY WITH STENT PLACEMENT;  Surgeon: Cleon Gustin, MD;  Location: [facility] ORS;  Service: Urology;  Laterality: Right;  . Cystoscopy w/ retrogrades  06/25/2015    Procedure: CYSTOSCOPY WITH RETROGRADE PYELOGRAM;  Surgeon: Cleon Gustin, MD;  Location: [facility] OR[facility];  Service: Urology;;    Home Medications:    Medication List       This list is accurate as of: 07/03/15 11:40 AM.  Always use your most recent med list.               metFORMIN 750 MG 24 hr tablet  Commonly known as:  GLUCOPHAGE-XR  Take 750 mg by mouth daily with breakfast.     ONE TOUCH ULTRA TEST test strip  Generic drug:  glucose blood  USE 2 (TWO) TIMES DAILY.     ONETOUCH DELICA  LANCETS 93Z Misc  USE 1 EACH 2 (TWO) TIMES DAILY. TO CHECK BLOOD SUGARS DAILY     oxyCODONE-acetaminophen 5-325 MG per tablet  Commonly known as:  ROXICET  Take 1 tablet by mouth every 4 (four) hours as needed for severe pain.     sulfamethoxazole-trimethoprim 800-160 MG per tablet  Commonly known as:  BACTRIM DS,SEPTRA DS  Take 1 tablet by mouth 2 (two) times daily.     tamsulosin 0.4 MG Caps capsule  Commonly known as:  FLOMAX  Take 1 capsule (0.4 mg total) by mouth daily after supper.        Allergies: No Known Allergies  Family History: Family History  Problem Relation Age of Onset  . Diabetes Father   . Diabetes Paternal Grandfather     Social History:  reports that he has never smoked. He does not have any smokeless tobacco history on file. He reports that he does not drink alcohol. His drug history is not on file.  ROS: UROLOGY Frequent Urination?: Yes Hard to postpone urination?: No Burning/pain with urination?: No Get up at night to urinate?: Yes Leakage of urine?: No Urine stream starts and stops?: No Trouble starting stream?: No Do you have to strain to urinate?: No Blood in urine?: No Urinary tract infection?: No Sexually transmitted disease?: No Injury to kidneys or bladder?: No Painful intercourse?: No Weak stream?: No Erection problems?: No Penile pain?: No  Gastrointestinal Nausea?: No  Vomiting?: No Indigestion/heartburn?: No Diarrhea?: No Constipation?: No  Constitutional Fever: No Night sweats?: No Weight loss?: No Fatigue?: No  Skin Skin rash/lesions?: No Itching?: No  Eyes Blurred vision?: No Double vision?: No  Ears/Nose/Throat Sore throat?: No Sinus problems?: No  Hematologic/Lymphatic Swollen glands?: No Easy bruising?: No  Cardiovascular Leg swelling?: No Chest pain?: No  Respiratory Cough?: No Shortness of breath?: No  Endocrine Excessive thirst?: No  Musculoskeletal Back pain?: No Joint pain?:  No  Neurological Headaches?: No Dizziness?: No  Psychologic Depression?: No Anxiety?: No  Physical Exam: BP 138/80 mmHg  Pulse 80  Resp 16  Ht 5\' 10"  (1.778 m)  Wt 187 lb 6.4 oz (85.004 kg)  BMI 26.89 kg/m2  Constitutional:  Alert and oriented, No acute distress. HEENT: [place] AT, moist mucus membranes.  Trachea midline, no masses. Cardiovascular: No clubbing, cyanosis, or edema, RRR Respiratory: Normal respiratory effort, no increased work of breathing, CTAB GI: Abdomen is soft, nontender, nondistended, no abdominal masses GU: mild right CVA tenderness. Skin: No rashes, bruises or suspicious lesions. Lymph: No cervical or inguinal adenopathy. Neurologic: Grossly intact, no focal deficits, moving all 4 extremities. Psychiatric: Normal mood and affect.  Laboratory Data: Lab Results  Component Value Date   WBC 10.0 06/26/2015   HGB 12.8* 06/26/2015   HCT 37.3* 06/26/2015   MCV 83.5 06/26/2015   PLT 160 06/26/2015    Lab Results  Component Value Date   CREATININE 1.24 06/25/2015    No results found for: PSA  No results found for: TESTOSTERONE  No results found for: HGBA1C  Urinalysis    Component Value Date/Time   COLORURINE YELLOW* 06/25/2015 0028   APPEARANCEUR CLEAR* 06/25/2015 0028   LABSPEC 1.016 06/25/2015 0028   PHURINE 5.0 06/25/2015 0028   GLUCOSEU NEGATIVE 06/25/2015 0028   HGBUR 2+* 06/25/2015 0028   BILIRUBINUR NEGATIVE 06/25/2015 0028   KETONESUR 1+* 06/25/2015 0028   PROTEINUR NEGATIVE 06/25/2015 0028   NITRITE NEGATIVE 06/25/2015 0028   LEUKOCYTESUR NEGATIVE 06/25/2015 0028    Pertinent Imaging: CLINICAL DATA: Right flank pain, onset yesterday, radiating into the groin. Nausea. History of kidney stones.  EXAM: CT ABDOMEN AND PELVIS WITHOUT CONTRAST  TECHNIQUE: Multidetector CT imaging of the abdomen and pelvis was performed following the standard protocol without IV contrast.  COMPARISON: None.  FINDINGS: The included lung  bases are clear.  There is a 6 x 7 mm stone in the right mid ureter with moderate proximal hydroureteronephrosis and mild perinephric stranding. Stone is at the level of L4. The ureter distal to this is decompressed. There are no additional nonobstructing right renal stones. There are 2 nonobstructing stones in the lower left kidney. Mild prominence of the left proximal ureter, the left distal ureter is decompressed. No ureteral stones. There is mild nonspecific stranding about the left kidney.  Evaluation of the remaining solid and hollow viscera is limited given lack of contrast. The unenhanced liver, gallbladder, adrenal glands, and pancreas are normal. Mild splenomegaly, spleen measures 14 cm in greatest dimension.  The stomach is decompressed. There are no dilated or thickened bowel loops. Small to moderate volume of colonic stool. The appendix is normal.  Mild mesenteric haziness. Small amount of fluid in the pelvis. This measures simple fluid density.  No retroperitoneal adenopathy. Abdominal aorta is normal in caliber. Mild atherosclerosis of the abdominal aorta without aneurysm.  Within the pelvis the bladder is physiologically distended without stone. Prostate gland is prominent. Calcification of the vas deferens. There is no pelvic adenopathy.  There are no acute or suspicious osseous abnormalities. There are scattered bone islands.  IMPRESSION: 1. Obstructing 6 x 7 mm stone in the right mid ureter with resultant hydroureteronephrosis. 2. Nonobstructing stones in the left kidney. 3. Incidental findings include mild splenomegaly. Small amount of free fluid in the pelvis is likely reactive, however nonspecific.  Assessment & Plan:    1. Nephrolithiasis- Obstructing 6 x 7 mm stone in the right mid ureter with resultant Hydroureteronephrosis. S/p stent placement. Plan for right URS with laser lithotripsy and stent placement for definitive management of  stone.  The risks of URS/LL/ureteral stent placement are explained to the patient, such as: stent pain (the patient is informed that about 50% of patients who undergo ureteroscopy and have a stent will have "stent pain," and this is by far the most common risk/complaint following ureteroscopy. A stent is a soft plastic tube (about half the size of IV tubing) that allows the kidney to drain to the bladder regardless of edema or obstruction. Not only can the stent "rub" on the inside of the bladder, causing a feeling of needing to urinate/overactive bladder, but also the stent allows urine to pass up from the bladder to the kidney during urination - causing symptoms from a warm, tingling sensation to intense pain in the affected flank), residual stones within the kidney or ureter may be present up to 40% of the time following ureteroscopy, depending on the original stone size and location and may need another procedure to rid the patient of stone burden, injury to the ureter is the most common intra-operative complication during ureteroscopy. The reported risk of perforation ranges greatly, depending on whether it is defined as a complete perforation (0.1-0.7% - think of this as a hole through the entire ureter), a partial perforation (1.6% - a hole nearly through the entire ureter), or mucosal tear/scrape (5% - these are similar to a sore on the inside of the mouth). Almost 100% of these will heal with prolonged stenting (anywhere between 2 - 4 weeks). Should a large perforation occur, your urologist may chose to stop the procedure and return on another day when the ureter has had time to heal. - Urinalysis, Complete - CULTURE, URINE COMPREHENSIVE   Return in about 4 weeks (around 07/31/2015) for after surgery to review RUS.  Herbert Moors, [facility] [address] [place], [place] [address] [phone]

## 2015-07-06 LAB — CULTURE, URINE COMPREHENSIVE

## 2015-07-07 ENCOUNTER — Other Ambulatory Visit: Payer: Federal, State, Local not specified - PPO

## 2015-07-07 ENCOUNTER — Encounter: Payer: Self-pay | Admitting: *Deleted

## 2015-07-07 NOTE — Patient Instructions (Addendum)
  Your procedure is scheduled on: 07-13-15 Report to [facility] To find out your arrival time please call [phone] between 1PM - 3PM on 07-10-15  Remember: Instructions that are not followed completely may result in serious medical risk, up to and including death, or upon the discretion of your surgeon and anesthesiologist your surgery may need to be rescheduled.    __X__ 1. Do not eat food or drink liquids after midnight. No gum chewing or hard candies.     __X__ 2. No Alcohol for 24 hours before or after surgery.   ____ 3. Bring all medications with you on the day of surgery if instructed.    ____ 4. Notify your doctor if there is any change in your medical condition     (cold, fever, infections).     Do not wear jewelry, make-up, hairpins, clips or nail polish.  Do not wear lotions, powders, or perfumes. You may wear deodorant.  Do not shave 48 hours prior to surgery. Men may shave face and neck.  Do not bring valuables to the hospital.    [facility] is not responsible for any belongings or valuables.               Contacts, dentures or bridgework may not be worn into surgery.  Leave your suitcase in the car. After surgery it may be brought to your room.  For patients admitted to the hospital, discharge time is determined by your  treatment team.   Patients discharged the day of surgery will not be allowed to drive home.   Please read over the following fact sheets that you were given:    ____ Take these medicines the morning of surgery with A SIP OF WATER:    1. NONE  2.   3.   4.  5.  6.  ____ Fleet Enema (as directed)   ____ Use CHG Soap as directed  ____ Use inhalers on the day of surgery  _X___ Stop metformin 2 days prior to surgery-LAST DOSE 07-10-15 (FRIDAY)    ____ Take 1/2 of usual insulin dose the night before surgery and none on the morning of surgery.   ____ Stop Coumadin/Plavix/aspirin-N/A  ____ Stop  Anti-inflammatories-NO NSAIDS OR ASA PRODUCTS-TYLENOL OK   ____ Stop supplements until after surgery.    ____ Bring C-Pap to the hospital.

## 2015-07-09 ENCOUNTER — Telehealth: Payer: Self-pay | Admitting: Radiology

## 2015-07-09 ENCOUNTER — Other Ambulatory Visit: Payer: Self-pay

## 2015-07-09 DIAGNOSIS — N200 Calculus of kidney: Secondary | ICD-10-CM

## 2015-07-09 NOTE — Telephone Encounter (Signed)
Pt's wife states she was told they would receive a call to schedule a renal US. There doesn't appear to be one ordered. Please advise.

## 2015-07-13 ENCOUNTER — Encounter
Admission: RE | Disposition: A | Discharge status: Home / Self Care | Payer: Self-pay | Source: Ambulatory Visit | Attending: Urology

## 2015-07-13 ENCOUNTER — Ambulatory Visit: Payer: Federal, State, Local not specified - PPO | Admitting: Certified Registered Nurse Anesthetist

## 2015-07-13 ENCOUNTER — Ambulatory Visit
Admission: RE | Admit: 2015-07-13 | Discharge: 2015-07-13 | Disposition: A | Discharge status: Home / Self Care | Payer: Federal, State, Local not specified - PPO | Source: Ambulatory Visit | Attending: Urology | Admitting: Urology

## 2015-07-13 ENCOUNTER — Encounter: Payer: Self-pay | Admitting: *Deleted

## 2015-07-13 DIAGNOSIS — Z7984 Long term (current) use of oral hypoglycemic drugs: Secondary | ICD-10-CM | POA: Insufficient documentation

## 2015-07-13 DIAGNOSIS — E119 Type 2 diabetes mellitus without complications: Secondary | ICD-10-CM | POA: Insufficient documentation

## 2015-07-13 DIAGNOSIS — Z79891 Long term (current) use of opiate analgesic: Secondary | ICD-10-CM | POA: Insufficient documentation

## 2015-07-13 DIAGNOSIS — N201 Calculus of ureter: Secondary | ICD-10-CM | POA: Diagnosis not present

## 2015-07-13 DIAGNOSIS — Z79899 Other long term (current) drug therapy: Secondary | ICD-10-CM | POA: Diagnosis not present

## 2015-07-13 DIAGNOSIS — Z87442 Personal history of urinary calculi: Secondary | ICD-10-CM | POA: Insufficient documentation

## 2015-07-13 DIAGNOSIS — Z466 Encounter for fitting and adjustment of urinary device: Secondary | ICD-10-CM | POA: Diagnosis not present

## 2015-07-13 HISTORY — PX: CYSTOSCOPY WITH STENT PLACEMENT: SHX5790

## 2015-07-13 HISTORY — PX: URETEROSCOPY WITH HOLMIUM LASER LITHOTRIPSY: SHX6645

## 2015-07-13 LAB — GLUCOSE, CAPILLARY
Glucose-Capillary: 118 mg/dL — ABNORMAL HIGH (ref 65–99)
Glucose-Capillary: 142 mg/dL — ABNORMAL HIGH (ref 65–99)

## 2015-07-13 SURGERY — CYSTOSCOPY, WITH STENT INSERTION
Anesthesia: General | Laterality: Right | Wound class: Clean Contaminated

## 2015-07-13 MED ORDER — BUPIVACAINE HCL 0.5 % IJ SOLN
INTRAMUSCULAR | Status: DC | PRN
  Administered 2015-07-13: 30 mL

## 2015-07-13 MED ORDER — SODIUM CHLORIDE 0.9 % IV SOLN
INTRAVENOUS | Status: DC
  Administered 2015-07-13: 09:00:00 via INTRAVENOUS

## 2015-07-13 MED ORDER — FENTANYL CITRATE (PF) 100 MCG/2ML IJ SOLN
INTRAMUSCULAR | Status: DC | PRN
  Administered 2015-07-13 (×4): 25 ug via INTRAVENOUS

## 2015-07-13 MED ORDER — BELLADONNA ALKALOIDS-OPIUM 16.2-60 MG RE SUPP
RECTAL | Status: AC
  Filled 2015-07-13: qty 1

## 2015-07-13 MED ORDER — ONDANSETRON HCL 4 MG/2ML IJ SOLN: 4.0000 mg | Freq: Once | INTRAMUSCULAR | Status: DC | PRN

## 2015-07-13 MED ORDER — FAMOTIDINE 20 MG PO TABS
ORAL_TABLET | ORAL | Status: AC
  Filled 2015-07-13: qty 1

## 2015-07-13 MED ORDER — DEXTROSE 5 % IV SOLN
1.0000 g | Freq: Once | INTRAVENOUS | Status: AC
  Administered 2015-07-13: 1 g via INTRAVENOUS
  Filled 2015-07-13: qty 10

## 2015-07-13 MED ORDER — FAMOTIDINE 20 MG PO TABS: 20.0000 mg | ORAL_TABLET | Freq: Once | ORAL | Status: DC

## 2015-07-13 MED ORDER — HYDROMORPHONE HCL 1 MG/ML IJ SOLN: 0.2500 mg | INTRAMUSCULAR | Status: DC | PRN

## 2015-07-13 MED ORDER — PROPOFOL 10 MG/ML IV BOLUS
INTRAVENOUS | Status: DC | PRN
  Administered 2015-07-13: 200 mg via INTRAVENOUS

## 2015-07-13 MED ORDER — ONDANSETRON HCL 4 MG/2ML IJ SOLN
INTRAMUSCULAR | Status: DC | PRN
  Administered 2015-07-13: 4 mg via INTRAVENOUS

## 2015-07-13 MED ORDER — GLYCOPYRROLATE 0.2 MG/ML IJ SOLN
INTRAMUSCULAR | Status: DC | PRN
Start: 2015-07-13 — End: 2015-07-13
  Administered 2015-07-13: 0.2 mg via INTRAVENOUS

## 2015-07-13 MED ORDER — OXYCODONE-ACETAMINOPHEN 5-325 MG PO TABS: 1.0000 | ORAL_TABLET | ORAL | Status: DC | PRN

## 2015-07-13 MED ORDER — BELLADONNA ALKALOIDS-OPIUM 16.2-60 MG RE SUPP
RECTAL | Status: DC | PRN
  Administered 2015-07-13: 1 via RECTAL

## 2015-07-13 MED ORDER — LIDOCAINE HCL (CARDIAC) 20 MG/ML IV SOLN
INTRAVENOUS | Status: DC | PRN
  Administered 2015-07-13: 60 mg via INTRAVENOUS

## 2015-07-13 MED ORDER — MIDAZOLAM HCL 2 MG/2ML IJ SOLN
INTRAMUSCULAR | Status: DC | PRN
  Administered 2015-07-13: 2 mg via INTRAVENOUS

## 2015-07-13 MED ORDER — BUPIVACAINE HCL (PF) 0.5 % IJ SOLN
INTRAMUSCULAR | Status: AC
  Filled 2015-07-13: qty 30

## 2015-07-13 SURGICAL SUPPLY — 28 items
BAG DRAIN CYSTO-URO LG1000N (MISCELLANEOUS) ×2 IMPLANT
CATH URETL 5X70 OPEN END (CATHETERS) IMPLANT
CNTNR SPEC 2.5X3XGRAD LEK (MISCELLANEOUS)
CONRAY 43 FOR UROLOGY 50M (MISCELLANEOUS) ×2 IMPLANT
CONT SPEC 4OZ STER OR WHT (MISCELLANEOUS)
CONTAINER SPEC 2.5X3XGRAD LEK (MISCELLANEOUS) IMPLANT
GLOVE BIO SURGEON STRL SZ7 (GLOVE) ×4 IMPLANT
GLOVE BIO SURGEON STRL SZ7.5 (GLOVE) ×2 IMPLANT
GOWN STRL REUS W/ TWL LRG LVL3 (GOWN DISPOSABLE) ×1 IMPLANT
GOWN STRL REUS W/ TWL XL LVL3 (GOWN DISPOSABLE) ×1 IMPLANT
GOWN STRL REUS W/TWL LRG LVL3 (GOWN DISPOSABLE) ×1
GOWN STRL REUS W/TWL XL LVL3 (GOWN DISPOSABLE) ×1
GUIDEWIRE STR ZIPWIRE 035X150 (MISCELLANEOUS) ×2 IMPLANT
INTRODUCER DILATOR DOUBLE (INTRODUCER) IMPLANT
KIT RM TURNOVER CYSTO AR (KITS) ×2 IMPLANT
LASER HOLMIUM PROCEDURE (MISCELLANEOUS) ×2 IMPLANT
PACK CYSTO AR (MISCELLANEOUS) ×2 IMPLANT
PREP PVP WINGED SPONGE (MISCELLANEOUS) ×2 IMPLANT
SENSORWIRE 0.038 NOT ANGLED (WIRE) ×2
SET CYSTO W/LG BORE CLAMP LF (SET/KITS/TRAYS/PACK) ×2 IMPLANT
SHEATH URETERAL 13/15X36 1L (SHEATH) IMPLANT
SOL .9 NS 3000ML IRR  AL (IV SOLUTION) ×1
SOL .9 NS 3000ML IRR UROMATIC (IV SOLUTION) ×1 IMPLANT
SOL PREP PVP 2OZ (MISCELLANEOUS) ×2
SOLUTION PREP PVP 2OZ (MISCELLANEOUS) ×1 IMPLANT
SURGILUBE 2OZ TUBE FLIPTOP (MISCELLANEOUS) ×2 IMPLANT
WATER STERILE IRR 1000ML POUR (IV SOLUTION) ×2 IMPLANT
WIRE SENSOR 0.038 NOT ANGLED (WIRE) ×1 IMPLANT

## 2015-07-13 NOTE — Op Note (Signed)
Preop ureteral calculous Postop same Procedure  Cysto, right ureteroscopy stent removal Anes: general Surgeon: Collier Flowers DO With the patient sterile draped,in the supine lithotomy position for ease of approach to the external genitalia we begin the procedure.  A time-out is taken and then with a 21FR Cystoscope shealth we ender the bladder.  30 degree lens is utilized. The stent is seen and removed tpo the distal urethra. A 35 Fr sensor wire is placed up the ureter to the kidney.  Fluroscopic guidance is utilized. A wolf 7 Fr scope is placed up the ureter  I  get beyond a scarred narrowing of the mid ureter.and with a 360 micron homium laser fiber I disintigrate the calcuous. No stent is necessary. The wire is withdrawn. the bladder is emptied thru the cystoscope sheath.  14ml of 0.5% marcaine is put in the bladder and sheath is withdrawn.    A60mg  Belladonna and opium suppository is placed in the rectum.  There a normal rectal exam is completed.  The bladder itself showed no turmors, masses or calculi  The patient was sent to the recovery room in satisfactory condition.

## 2015-07-13 NOTE — H&P (Signed)
Right sided calculous mid ureter. Plan ureteroscopy and laser lithotripsy.  HS RRR without murmur.  Lungs CTA

## 2015-07-13 NOTE — Transfer of Care (Signed)
Immediate Anesthesia Transfer of Care Note  Patient: [name], retail  Procedure(s) Performed: Procedure(s): CYSTOSCOPY WITH STENT REMOVAL  (Right) URETEROSCOPY WITH HOLMIUM LASER LITHOTRIPSY (Right)  Patient Location: PACU  Anesthesia Type:General  Level of Consciousness: sedated  Airway & Oxygen Therapy: Patient Spontanous Breathing and Patient connected to face mask oxygen  Post-op Assessment: Report given to RN and Post -op Vital signs reviewed and stable  Post vital signs: Reviewed and stable  Last Vitals:  Filed Vitals:   07/13/15 0948  BP:   Pulse: 68  Temp: 36.4 C  Resp: 9    Complications: No apparent anesthesia complications

## 2015-07-13 NOTE — Discharge Instructions (Signed)
° °                                                      Ureteroscopy ° °A Ureteroscopy is an examination of the upper urinary tract, performed with a ureteroscope that is passed through the urethra, the bladder, and then directly into the ureter. It is performed to find the cause of urine blockage in a ureter, perform a biopsy or identify and evaluate other abnormalities inside the ureters or kidneys. It is useful in the diagnosis and treatment of disorders such as kidney stones, ureteral strictures or other abnormalities of the ureter. Smaller stones in the bladder or lower ureter can be removed in one piece, while bigger ones are usually broken before removal during ureteroscopy. ° °*After your ureteroscopy, your doctor may need to place a stent in a ureter to drain urine from the kidney to the bladder while swelling in the ureter goes away. The stent may cause some discomfort to your kidney and ureter. The discomfort is generally mild. The stent may be left in for a week or more. You will be instructed to either remove the stent yourself at home by pulling a string protruding from your urethra or to return to our office for removal by your doctor. ° °After a ureteroscopy you may  °have a mild burning feeling when urinating °see small amounts of blood in the urine °have mild discomfort in the bladder area or kidney area when urinating °need to urinate more frequently or urgently °*These problems should not last more than 24 hours unless a ureteral stent was placed. ° °If a stent was placed symptoms symptoms may continue until it is removed. You should notify your doctor right away if bleeding or pain is severe. ° °To prevent or relieve discomfort it can be helpful to °drink 16 ounces of water each hour for 2 hours after the procedure °take a warm bath to relieve the burning feeling °hold a warm, damp washcloth over the urethral opening to relieve discomfort °take an over-the-counter pain reliever ° °Please notify  our office if you experience any of the following symptoms °burning with urination lasting more than 24hrs °Fever greater than 100F or present directly to emergency department °abdominal or flank pain °very bloody, cloudy or foul smelling urine ° °*If you have any additional questions or concerns please call our office at [phone] ° °[facility] °[address], [address] °[place], [place] [address] °[phone] ° ° ° ° ° °

## 2015-07-13 NOTE — Anesthesia Preprocedure Evaluation (Addendum)
Anesthesia Evaluation  Patient identified by MRN, date of birth, ID band Patient awake    Reviewed: Allergy & Precautions, NPO status , Patient's Chart, lab work & pertinent test results  Airway Mallampati: I  TM Distance: >3 FB Neck ROM: Full    Dental  (+) Teeth Intact   Pulmonary    Pulmonary exam normal        Cardiovascular Exercise Tolerance: Good Normal cardiovascular exam     Neuro/Psych    GI/Hepatic   Endo/Other  diabetes, Well Controlled, Type 2BG 118.  Renal/GU Stones.     Musculoskeletal   Abdominal (+)  Abdomen: soft.    Peds  Hematology   Anesthesia Other Findings   Reproductive/Obstetrics                            Anesthesia Physical Anesthesia Plan  ASA: III  Anesthesia Plan: General   Post-op Pain Management:    Induction: Intravenous  Airway Management Planned: LMA  Additional Equipment:   Intra-op Plan:   Post-operative Plan: Extubation in OR  Informed Consent: I have reviewed the patients History and Physical, chart, labs and discussed the procedure including the risks, benefits and alternatives for the proposed anesthesia with the patient or authorized representative who has indicated his/her understanding and acceptance.     Plan Discussed with: CRNA  Anesthesia Plan Comments:         Anesthesia Quick Evaluation

## 2015-07-13 NOTE — Anesthesia Postprocedure Evaluation (Signed)
  Anesthesia Post-op Note  Patient: [name], retail  Procedure(s) Performed: Procedure(s): CYSTOSCOPY WITH STENT REMOVAL  (Right) URETEROSCOPY WITH HOLMIUM LASER LITHOTRIPSY (Right)  Anesthesia type:General  Patient location: PACU  Post pain: Pain level controlled  Post assessment: Post-op Vital signs reviewed, Patient's Cardiovascular Status Stable, Respiratory Function Stable, Patent Airway and No signs of Nausea or vomiting  Post vital signs: Reviewed and stable  Last Vitals:  Filed Vitals:   07/13/15 0948  BP:   Pulse: 68  Temp: 36.4 C  Resp: 9    Level of consciousness: awake, alert  and patient cooperative  Complications: No apparent anesthesia complications

## 2015-07-29 ENCOUNTER — Ambulatory Visit (INDEPENDENT_AMBULATORY_CARE_PROVIDER_SITE_OTHER): Payer: Federal, State, Local not specified - PPO | Admitting: Urology

## 2015-07-29 ENCOUNTER — Encounter: Payer: Self-pay | Admitting: Urology

## 2015-07-29 VITALS — BP 129/82 | HR 73 | Ht 70.0 in | Wt 193.7 lb

## 2015-07-29 DIAGNOSIS — N521 Erectile dysfunction due to diseases classified elsewhere: Secondary | ICD-10-CM | POA: Diagnosis not present

## 2015-07-29 DIAGNOSIS — N201 Calculus of ureter: Secondary | ICD-10-CM | POA: Diagnosis not present

## 2015-07-29 LAB — URINALYSIS, COMPLETE
BILIRUBIN UA: NEGATIVE
GLUCOSE, UA: NEGATIVE
Ketones, UA: NEGATIVE
Leukocytes, UA: NEGATIVE
NITRITE UA: NEGATIVE
PH UA: 5.5 (ref 5.0–7.5)
PROTEIN UA: NEGATIVE
RBC UA: NEGATIVE
Specific Gravity, UA: 1.02 (ref 1.005–1.030)
UUROB: 0.2 mg/dL (ref 0.2–1.0)

## 2015-07-29 LAB — MICROSCOPIC EXAMINATION
Bacteria, UA: NONE SEEN
Epithelial Cells (non renal): NONE SEEN /hpf (ref 0–10)
RBC MICROSCOPIC, UA: NONE SEEN /HPF (ref 0–?)
RENAL EPITHEL UA: NONE SEEN /HPF
WBC UA: NONE SEEN /HPF (ref 0–?)

## 2015-07-29 NOTE — Progress Notes (Signed)
Patient has had a successful ureteroscopy and removal of calculus disintegration of same in the mid ureter. I did not leave a stent in. He had no post op pain or spasms on the right side. He has several small calculi on the left in the renal collecting system. These will be followed with a KUB at 8 months. The present time am going to obtain a 24-hour urine for calculi analysis from a metabolic standpoint. I discussed this with he and his wife. Secondarily the patient is diabetic had recent problems with his erections. He is having difficulty holding his erections for a long enough time to have an orgasm. So I'm going to give him some Viagra or Cialis samples today and come back in a month. At that time we should know the results of his 24-hour urine.

## 2015-08-07 ENCOUNTER — Ambulatory Visit
Admission: RE | Admit: 2015-08-07 | Discharge: 2015-08-07 | Disposition: A | Discharge status: Home / Self Care | Payer: Federal, State, Local not specified - PPO | Source: Ambulatory Visit | Attending: Obstetrics and Gynecology | Admitting: Obstetrics and Gynecology

## 2015-08-07 DIAGNOSIS — N200 Calculus of kidney: Secondary | ICD-10-CM | POA: Diagnosis present

## 2015-08-10 ENCOUNTER — Ambulatory Visit (INDEPENDENT_AMBULATORY_CARE_PROVIDER_SITE_OTHER): Payer: Federal, State, Local not specified - PPO | Admitting: Obstetrics and Gynecology

## 2015-08-10 ENCOUNTER — Encounter: Payer: Self-pay | Admitting: Obstetrics and Gynecology

## 2015-08-10 VITALS — BP 124/78 | HR 81 | Resp 16 | Ht 70.0 in | Wt 195.0 lb

## 2015-08-10 DIAGNOSIS — N200 Calculus of kidney: Secondary | ICD-10-CM

## 2015-08-10 LAB — URINALYSIS, COMPLETE
Bilirubin, UA: NEGATIVE
GLUCOSE, UA: NEGATIVE
KETONES UA: NEGATIVE
Leukocytes, UA: NEGATIVE
NITRITE UA: NEGATIVE
Protein, UA: NEGATIVE
SPEC GRAV UA: 1.025 (ref 1.005–1.030)
UUROB: 0.2 mg/dL (ref 0.2–1.0)
pH, UA: 5.5 (ref 5.0–7.5)

## 2015-08-10 LAB — MICROSCOPIC EXAMINATION
Bacteria, UA: NONE SEEN
EPITHELIAL CELLS (NON RENAL): NONE SEEN /HPF (ref 0–10)
Renal Epithel, UA: NONE SEEN /hpf
WBC UA: NONE SEEN /HPF (ref 0–?)

## 2015-08-10 MED ORDER — TAMSULOSIN HCL 0.4 MG PO CAPS: 0.4000 mg | ORAL_CAPSULE | Freq: Every day | ORAL | Status: DC

## 2015-08-10 NOTE — Progress Notes (Signed)
08/10/2015 11:30 AM   Nicholas Bentley 1965/10/15 [id number]  Referring provider: Juluis Pitch, MD [place], [place] [address]  Chief Complaint  Patient presents with  . Nephrolithiasis    4 wk post-op    HPI: Patient is 4 weeks s/p right URS with laser lithotripsy on 07/13/15.  Patient's post operative course has been uneventful.  He reports feeling well today.  He presents today to review his RUS results.  Previous History:  49yo with a hx of nephrolithiasis and DMII who presented to the ED on with a 1 day hx of severe sharp, intermittent, non-radiating right flank pain. He has associated nausea/vomiting. CT scan shows a 34m rigth mid ureteral stone with moderate hydronephrosis, perinephric stranding and an enlarge kidney concerning for pyelonephritis. He has bilateral lower pole small renal calculi. He was found to have elevated WBCs and ultimately a left ureteral stent was placed by Dr. MAlyson Ingleson 06/25/15. He was discharged on Bactrim pending urine culture results which ultimately showed no growth.  Patient presents today for preop appointment for definitive treatment of right ureteral stone. These reports feeling much better since stent was placed. He denies any fevers.    PMH: Past Medical History  Diagnosis Date  . Diabetes mellitus without complication ([place]   . Kidney stone   . Controlled diabetes mellitus type II without complication ([place] 97/79/3903 . Ureteral calculi 06/25/2015    Surgical History: Past Surgical History  Procedure Laterality Date  . Cystoscopy w/ ureteral stent placement Right 06/25/2015    Procedure: CYSTOSCOPY WITH STENT PLACEMENT;  Surgeon: PCleon Gustin MD;  Location: [facility] ORS;  Service: Urology;  Laterality: Right;  . Cystoscopy w/ retrogrades  06/25/2015    Procedure: CYSTOSCOPY WITH RETROGRADE PYELOGRAM;  Surgeon: PCleon Gustin MD;  Location: [facility] ORS;  Service:  Urology;;  . Cystoscopy with stent placement Right 07/13/2015    Procedure: CYSTOSCOPY WITH STENT REMOVAL ;  Surgeon: RCollier Flowers MD;  Location: [facility] ORS;  Service: Urology;  Laterality: Right;  . Ureteroscopy with holmium laser lithotripsy Right 07/13/2015    Procedure: URETEROSCOPY WITH HOLMIUM LASER LITHOTRIPSY;  Surgeon: RCollier Flowers MD;  Location: [facility] ORS;  Service: Urology;  Laterality: Right;    Home Medications:    Medication List       This list is accurate as of: 08/10/15 11:30 AM.  Always use your most recent med list.               acetaminophen 325 MG tablet  Commonly known as:  TYLENOL  Take 650 mg by mouth every 6 (six) hours as needed.     metFORMIN 750 MG 24 hr tablet  Commonly known as:  GLUCOPHAGE-XR  Take 750 mg by mouth daily with breakfast.     ONE TOUCH ULTRA TEST test strip  Generic drug:  glucose blood  USE 2 (TWO) TIMES DAILY.     ONETOUCH DELICA LANCETS 300PMisc  USE 1 EACH 2 (TWO) TIMES DAILY. TO CHECK BLOOD SUGARS DAILY     oxyCODONE-acetaminophen 5-325 MG tablet  Commonly known as:  ROXICET  Take 1 tablet by mouth every 4 (four) hours as needed for severe pain.     tamsulosin 0.4 MG Caps capsule  Commonly known as:  FLOMAX  Take 1 capsule (0.4 mg total) by mouth daily.        Allergies: No Known Allergies  Family History: Family History  Problem  Relation Age of Onset  . Diabetes Father   . Diabetes Paternal Grandfather   . Prostate cancer Neg Hx   . Bladder Cancer Neg Hx   . Renal cancer Neg Hx     Social History:  reports that he has never smoked. He does not have any smokeless tobacco history on file. He reports that he does not drink alcohol. His drug history is not on file.  ROS: UROLOGY Frequent Urination?: No Hard to postpone urination?: No Burning/pain with urination?: No Get up at night to urinate?: No Leakage of urine?: No Urine stream starts and stops?: No Trouble starting stream?: No Do you have to  strain to urinate?: No Blood in urine?: No Urinary tract infection?: No Sexually transmitted disease?: No Injury to kidneys or bladder?: No Painful intercourse?: No Weak stream?: No Erection problems?: No Penile pain?: No  Gastrointestinal Nausea?: No Vomiting?: No Indigestion/heartburn?: No Diarrhea?: No Constipation?: No  Constitutional Fever: No Night sweats?: No Weight loss?: No Fatigue?: No  Skin Skin rash/lesions?: No Itching?: No  Eyes Blurred vision?: No Double vision?: No  Ears/Nose/Throat Sore throat?: No Sinus problems?: No  Hematologic/Lymphatic Swollen glands?: No Easy bruising?: No  Cardiovascular Leg swelling?: No Chest pain?: No  Respiratory Cough?: No Shortness of breath?: No  Endocrine Excessive thirst?: No  Musculoskeletal Back pain?: No Joint pain?: No  Neurological Headaches?: No Dizziness?: No  Psychologic Depression?: No Anxiety?: No  Physical Exam: BP 124/78 mmHg  Pulse 81  Resp 16  Ht '5\' 10"'  (1.778 m)  Wt 195 lb (88.451 kg)  BMI 27.98 kg/m2  Constitutional:  Alert and oriented, No acute distress. HEENT: [place] AT, moist mucus membranes.  Trachea midline, no masses. Cardiovascular: No clubbing, cyanosis, or edema. Respiratory: Normal respiratory effort, no increased work of breathing. Skin: No rashes, bruises or suspicious lesions. Lymph: No cervical adenopathy. Neurologic: Grossly intact, no focal deficits, moving all 4 extremities. Psychiatric: Normal mood and affect.  Laboratory Data:   Urinalysis    Component Value Date/Time   COLORURINE YELLOW* 06/25/2015 0028   APPEARANCEUR CLEAR* 06/25/2015 0028   LABSPEC 1.016 06/25/2015 0028   PHURINE 5.0 06/25/2015 0028   GLUCOSEU Negative 07/29/2015 1135   HGBUR 2+* 06/25/2015 0028   BILIRUBINUR Negative 07/29/2015 1135   BILIRUBINUR NEGATIVE 06/25/2015 0028   KETONESUR 1+* 06/25/2015 0028   PROTEINUR NEGATIVE 06/25/2015 0028   NITRITE Negative 07/29/2015 1135    NITRITE NEGATIVE 06/25/2015 0028   LEUKOCYTESUR Negative 07/29/2015 1135   LEUKOCYTESUR NEGATIVE 06/25/2015 0028    Pertinent Imaging:   Assessment & Plan:    1. Nephrolithiasis- Dr. Elnoria Howard mentioned ordering a 24hr urinalysis in his previous note.  Patient states that he never received a kit.  [facility] reordered today. RUS showing no hydronephrosis. Continued left nephrolithiasis. Patient does not deire intervention at this time. Will follow up for yearly KUBs. RTC 6 weeks for [facility] results.  - Urinalysis, Complete   Return in about 6 weeks (around 09/21/2015).  These notes generated with voice recognition software. I apologize for typographical errors.  Herbert Moors, [facility] [address], [place], [place] [address] [phone]

## 2015-08-11 ENCOUNTER — Encounter: Payer: Self-pay | Admitting: Obstetrics and Gynecology

## 2015-09-06 ENCOUNTER — Other Ambulatory Visit: Payer: Self-pay | Admitting: Obstetrics and Gynecology

## 2015-09-06 DIAGNOSIS — N200 Calculus of kidney: Secondary | ICD-10-CM

## 2015-09-14 ENCOUNTER — Encounter: Payer: Self-pay | Admitting: Obstetrics and Gynecology

## 2015-09-21 ENCOUNTER — Ambulatory Visit: Payer: Federal, State, Local not specified - PPO | Admitting: Obstetrics and Gynecology

## 2015-10-14 ENCOUNTER — Encounter: Payer: Self-pay | Admitting: Obstetrics and Gynecology

## 2015-10-14 ENCOUNTER — Telehealth: Payer: Self-pay

## 2015-10-14 NOTE — Telephone Encounter (Signed)
Please cancel pt appt. And reschedule. I will message the pt back.    I will need to cancel the appointment on Jan 10 and reschedule please.

## 2015-10-20 ENCOUNTER — Ambulatory Visit: Payer: Federal, State, Local not specified - PPO | Admitting: Obstetrics and Gynecology

## 2015-10-23 NOTE — Telephone Encounter (Signed)
Patient has not done the 24 hours kit yet and will cb when he has done it and is ready to schedule an appt   michelle

## 2015-11-24 ENCOUNTER — Other Ambulatory Visit: Payer: Federal, State, Local not specified - PPO

## 2015-11-27 ENCOUNTER — Encounter: Payer: Self-pay | Admitting: Obstetrics and Gynecology

## 2015-12-22 ENCOUNTER — Encounter: Payer: Self-pay | Admitting: Urology

## 2015-12-22 ENCOUNTER — Ambulatory Visit (INDEPENDENT_AMBULATORY_CARE_PROVIDER_SITE_OTHER): Payer: Federal, State, Local not specified - PPO | Admitting: Urology

## 2015-12-22 DIAGNOSIS — N201 Calculus of ureter: Secondary | ICD-10-CM | POA: Diagnosis not present

## 2015-12-22 DIAGNOSIS — R82994 Hypercalciuria: Secondary | ICD-10-CM | POA: Insufficient documentation

## 2015-12-22 NOTE — Progress Notes (Signed)
12/22/2015 11:28 AM   Nicholas Bentley [birth year] [id number]  Referring provider: Juluis Pitch, MD [address] [place], [place] [address]  Chief Complaint  Patient presents with  . Follow-up    l[facility]ol[facility] results    HPI: Nicholas Bentley is a 50yo seen in followup for nephrolithiasis. He has had 4 stone events since age 25. He has bilateral renal calculi. He denies nay pain.    24 hr urine showed Ca 504, Na 369.Marland Kitchen Serum calcium 9.7  Good volume   PMH: Past Medical History  Diagnosis Date  . Diabetes mellitus without complication ([place])   . Kidney stone   . Controlled diabetes mellitus type II without complication ([place]) 03/23/4314  . Ureteral calculi 06/25/2015    Surgical History: Past Surgical History  Procedure Laterality Date  . Cystoscopy w/ ureteral stent placement Right 06/25/2015    Procedure: CYSTOSCOPY WITH STENT PLACEMENT;  Surgeon: Cleon Gustin, MD;  Location: [facility] ORS;  Service: Urology;  Laterality: Right;  . Cystoscopy w/ retrogrades  06/25/2015    Procedure: CYSTOSCOPY WITH RETROGRADE PYELOGRAM;  Surgeon: Cleon Gustin, MD;  Location: [facility];  Service: Urology;;  . Cystoscopy with stent placement Right 07/13/2015    Procedure: CYSTOSCOPY WITH STENT REMOVAL ;  Surgeon: Collier Flowers, MD;  Location: [facility] OR[facility];  Service: Urology;  Laterality: Right;  . Ureteroscopy with holmium laser lithotripsy Right 07/13/2015    Procedure: URETEROSCOPY WITH HOLMIUM LASER LITHOTRIPSY;  Surgeon: Collier Flowers, MD;  Location: [facility] OR[facility];  Service: Urology;  Laterality: Right;    Home Medications:    Medication List       This list is accurate as of: 12/22/15 11:28 AM.  Always use your most recent med list.               acetaminophen 325 MG tablet  Commonly known as:  TYLENOL  Take 650 mg by mouth every 6 (six) hours as needed.     metFORMIN 750 MG 24 hr tablet  Commonly known as:  GLUCOPHAGE-XR  Take 750 mg by mouth daily with breakfast.     ONE TOUCH  ULTRA SYSTEM KIT w/Device Kit     ONE TOUCH ULTRA TEST test strip  Generic drug:  glucose blood  USE 2 (TWO) TIMES DAILY.     ONETOUCH DELICA LANCETS 40G Misc  USE 1 EACH 2 (TWO) TIMES DAILY. TO CHECK BLOOD SUGARS DAILY     oxyCODONE-acetaminophen 5-325 MG tablet  Commonly known as:  ROXICET  Take 1 tablet by mouth every 4 (four) hours as needed for severe pain.     sildenafil 20 MG tablet  Commonly known as:  REVATIO  Take by mouth. Reported on 12/22/2015     tamsulosin 0.4 MG Caps capsule  Commonly known as:  FLOMAX  TAKE 1 CAPSULE (0.4 MG TOTAL) BY MOUTH DAILY.        Allergies: No Known Allergies  Family History: Family History  Problem Relation Age of Onset  . Diabetes Father   . Diabetes Paternal Grandfather   . Prostate cancer Neg Hx   . Bladder Cancer Neg Hx   . Renal cancer Neg Hx     Social History:  reports that he has never smoked. He does not have any smokeless tobacco history on file. He reports that he does not drink alcohol. His drug history is not on file.  ROS:  Physical Exam: BP 138/83 mmHg  Pulse 81  Ht '5\' 10"'  (1.778 m)  Wt 97.478 kg (214 lb 14.4 oz)  BMI 30.83 kg/m2  Constitutional:  Alert and oriented, No acute distress. HEENT: [place] AT, moist mucus membranes.  Trachea midline, no masses. Cardiovascular: No clubbing, cyanosis, or edema. Respiratory: Normal respiratory effort, no increased work of breathing. GI: Abdomen is soft, nontender, nondistended, no abdominal masses GU: No CVA tenderness.  Skin: No rashes, bruises or suspicious lesions. Lymph: No cervical or inguinal adenopathy. Neurologic: Grossly intact, no focal deficits, moving all 4 extremities. Psychiatric: Normal mood and affect.  Laboratory Data: Lab Results  Component Value Date   WBC 10.0 06/26/2015   HGB 12.8* 06/26/2015   HCT 37.3* 06/26/2015   MCV 83.5 06/26/2015   PLT 160 06/26/2015    Lab Results    Component Value Date   CREATININE 1.24 06/25/2015    No results found for: PSA  No results found for: TESTOSTERONE  No results found for: HGBA1C  Urinalysis    Component Value Date/Time   COLORURINE YELLOW* 06/25/2015 0028   APPEARANCEUR CLEAR* 06/25/2015 0028   LABSPEC 1.016 06/25/2015 0028   PHURINE 5.0 06/25/2015 0028   GLUCOSEU Negative 08/10/2015 1058   HGBUR 2+* 06/25/2015 0028   BILIRUBINUR Negative 08/10/2015 1058   BILIRUBINUR NEGATIVE 06/25/2015 0028   KETONESUR 1+* 06/25/2015 0028   PROTEINUR NEGATIVE 06/25/2015 0028   NITRITE Negative 08/10/2015 1058   NITRITE NEGATIVE 06/25/2015 0028   LEUKOCYTESUR Negative 08/10/2015 1058   LEUKOCYTESUR NEGATIVE 06/25/2015 0028    Pertinent Imaging: none  Assessment & Plan:    1. Nephrolithiasis -renal US in 3 months  2. Hypercalciuria -iPTH today, will call with results. If elevated, refer to general surgery -if iPTH normal, pt will need to start indapamide 2.29m daily    No Follow-up on file.  MCleon Gustin [facility] [address] [place] [phone]

## 2015-12-23 LAB — PARATHYROID HORMONE, INTACT (NO CA): PTH: 26 pg/mL (ref 15–65)

## 2015-12-25 ENCOUNTER — Telehealth: Payer: Self-pay

## 2015-12-25 ENCOUNTER — Encounter: Payer: Self-pay | Admitting: Obstetrics and Gynecology

## 2015-12-27 NOTE — Telephone Encounter (Signed)
There is no message with this encounter.

## 2015-12-28 ENCOUNTER — Other Ambulatory Visit: Payer: Self-pay

## 2015-12-28 DIAGNOSIS — R82994 Hypercalciuria: Secondary | ICD-10-CM

## 2015-12-28 MED ORDER — INDAPAMIDE 2.5 MG PO TABS: 2.5000 mg | ORAL_TABLET | Freq: Every day | ORAL | Status: DC

## 2015-12-28 NOTE — Telephone Encounter (Signed)
Pt wife called requesting lab results(PTH) that was ordered last week by Dr. Alyson Ingles. I spoke w/ Larene Beach and pt wife was notified and medication (Indapamide 2.5mg ) was sent in to [facility] Dr.

## 2015-12-28 NOTE — Progress Notes (Signed)
Pt wife called requesting lab results that was ordered last week by Dr. Alyson Ingles. I spoke w/ Larene Beach and pt wife was notified and medication (Indapamide 2.5mg ) was sent in to [facility] Dr.

## 2016-03-22 ENCOUNTER — Ambulatory Visit: Payer: Federal, State, Local not specified - PPO

## 2016-03-28 ENCOUNTER — Encounter
Admission: RE | Disposition: A | Discharge status: Home / Self Care | Payer: Self-pay | Source: Ambulatory Visit | Attending: Gastroenterology

## 2016-03-28 ENCOUNTER — Encounter: Payer: Self-pay | Admitting: *Deleted

## 2016-03-28 ENCOUNTER — Ambulatory Visit: Payer: Federal, State, Local not specified - PPO | Admitting: Anesthesiology

## 2016-03-28 ENCOUNTER — Ambulatory Visit
Admission: RE | Admit: 2016-03-28 | Discharge: 2016-03-28 | Disposition: A | Discharge status: Home / Self Care | Payer: Federal, State, Local not specified - PPO | Source: Ambulatory Visit | Attending: Gastroenterology | Admitting: Gastroenterology

## 2016-03-28 DIAGNOSIS — K5730 Diverticulosis of large intestine without perforation or abscess without bleeding: Secondary | ICD-10-CM | POA: Diagnosis not present

## 2016-03-28 DIAGNOSIS — D125 Benign neoplasm of sigmoid colon: Secondary | ICD-10-CM | POA: Diagnosis not present

## 2016-03-28 DIAGNOSIS — D128 Benign neoplasm of rectum: Secondary | ICD-10-CM | POA: Diagnosis not present

## 2016-03-28 DIAGNOSIS — Z79899 Other long term (current) drug therapy: Secondary | ICD-10-CM | POA: Diagnosis not present

## 2016-03-28 DIAGNOSIS — E119 Type 2 diabetes mellitus without complications: Secondary | ICD-10-CM | POA: Insufficient documentation

## 2016-03-28 DIAGNOSIS — Z1211 Encounter for screening for malignant neoplasm of colon: Secondary | ICD-10-CM | POA: Insufficient documentation

## 2016-03-28 DIAGNOSIS — Z7984 Long term (current) use of oral hypoglycemic drugs: Secondary | ICD-10-CM | POA: Diagnosis not present

## 2016-03-28 HISTORY — PX: COLONOSCOPY WITH PROPOFOL: SHX5780

## 2016-03-28 LAB — GLUCOSE, CAPILLARY: GLUCOSE-CAPILLARY: 191 mg/dL — AB (ref 65–99)

## 2016-03-28 SURGERY — COLONOSCOPY WITH PROPOFOL
Anesthesia: General

## 2016-03-28 MED ORDER — FENTANYL CITRATE (PF) 100 MCG/2ML IJ SOLN
INTRAMUSCULAR | Status: DC | PRN
  Administered 2016-03-28: 50 ug via INTRAVENOUS

## 2016-03-28 MED ORDER — SODIUM CHLORIDE 0.9 % IV SOLN: INTRAVENOUS | Status: DC

## 2016-03-28 MED ORDER — PROPOFOL 500 MG/50ML IV EMUL
INTRAVENOUS | Status: DC | PRN
  Administered 2016-03-28: 160 ug/kg/min via INTRAVENOUS

## 2016-03-28 MED ORDER — MIDAZOLAM HCL 5 MG/5ML IJ SOLN
INTRAMUSCULAR | Status: DC | PRN
  Administered 2016-03-28: 1 mg via INTRAVENOUS

## 2016-03-28 MED ORDER — SODIUM CHLORIDE 0.9 % IV SOLN
INTRAVENOUS | Status: DC
  Administered 2016-03-28: 1000 mL via INTRAVENOUS
  Administered 2016-03-28: 07:00:00 via INTRAVENOUS

## 2016-03-28 MED ORDER — PROPOFOL 10 MG/ML IV BOLUS
INTRAVENOUS | Status: DC | PRN
  Administered 2016-03-28: 100 mg via INTRAVENOUS

## 2016-03-28 MED ORDER — LIDOCAINE 2% (20 MG/ML) 5 ML SYRINGE
INTRAMUSCULAR | Status: DC | PRN
  Administered 2016-03-28: 40 mg via INTRAVENOUS

## 2016-03-28 NOTE — Transfer of Care (Signed)
Immediate Anesthesia Transfer of Care Note  Patient: Nicholas Bentley  Procedure(s) Performed: Procedure(s): COLONOSCOPY WITH PROPOFOL (N/A)  Patient Location: PACU and Endoscopy Unit  Anesthesia Type:General  Level of Consciousness: sedated  Airway & Oxygen Therapy: Patient Spontanous Breathing and Patient connected to nasal cannula oxygen  Post-op Assessment: Report given to RN and Post -op Vital signs reviewed and stable  Post vital signs: Reviewed and stable  Last Vitals:  Filed Vitals:   03/28/16 0659  BP: 154/88  Pulse: 99  Temp: 35.9 C  Resp: 18    Last Pain: There were no vitals filed for this visit.       Complications: No apparent anesthesia complications

## 2016-03-28 NOTE — H&P (Signed)
Outpatient short stay form Pre-procedure 03/28/2016 7:39 AM Lollie Sails MD  Primary Physician: Dr. Juluis Pitch  Reason for visit:  Colonoscopy  History of present illness:  Patient is a 50 year old male presenting today for screening colonoscopy. This is his first colonoscopy. He tolerated his prep well. He takes no aspirin products or blood thinning agents. There is no family history of colon cancer.    Current facility-administered medications:  .  0.9 %  sodium chloride infusion, , Intravenous, Continuous, Lollie Sails, MD, Last Rate: 20 mL/hr at 03/28/16 0719 .  0.9 %  sodium chloride infusion, , Intravenous, Continuous, Lollie Sails, MD  Prescriptions prior to admission  Medication Sig Dispense Refill Last Dose  . acetaminophen (TYLENOL) 325 MG tablet Take 650 mg by mouth every 6 (six) hours as needed.   Taking  . Blood Glucose Monitoring Suppl (ONE TOUCH ULTRA SYSTEM KIT) w/Device KIT    Taking  . indapamide (LOZOL) 2.5 MG tablet Take 1 tablet (2.5 mg total) by mouth daily. 30 tablet 3   . metFORMIN (GLUCOPHAGE-XR) 750 MG 24 hr tablet Take 750 mg by mouth daily with breakfast.   Taking  . ONE TOUCH ULTRA TEST test strip USE 2 (TWO) TIMES DAILY.  5 Taking  . ONETOUCH DELICA LANCETS 09N MISC USE 1 EACH 2 (TWO) TIMES DAILY. TO CHECK BLOOD SUGARS DAILY  5 Taking  . oxyCODONE-acetaminophen (ROXICET) 5-325 MG per tablet Take 1 tablet by mouth every 4 (four) hours as needed for severe pain. (Patient not taking: Reported on 07/03/2015) 30 tablet 0 Not Taking  . sildenafil (REVATIO) 20 MG tablet Take by mouth. Reported on 12/22/2015   Not Taking  . tamsulosin (FLOMAX) 0.4 MG CAPS capsule TAKE 1 CAPSULE (0.4 MG TOTAL) BY MOUTH DAILY. (Patient not taking: Reported on 12/22/2015) 30 capsule 0 Not Taking     No Known Allergies   Past Medical History  Diagnosis Date  . Diabetes mellitus without complication ([place])   . Controlled diabetes mellitus type II without  complication ([place]) 2/35/5732  . Ureteral calculi 06/25/2015  . Kidney stone     Review of systems:      Physical Exam    Heart and lungs: Regular rate and rhythm without rub or gallop lungs are bilaterally clear    HEENT: Normocephalic atraumatic eyes are anicteric    Other:     Pertinant exam for procedure: Soft nontender nondistended bowel sounds positive normoactive.    Planned proceedures: Colonoscopy and indicated procedures. I have discussed the risks benefits and complications of procedures to include not limited to bleeding, infection, perforation and the risk of sedation and the patient wishes to proceed.    Lollie Sails, MD Gastroenterology 03/28/2016  7:39 AM

## 2016-03-28 NOTE — Anesthesia Preprocedure Evaluation (Signed)
Anesthesia Evaluation  Patient identified by MRN, date of birth, ID band Patient awake    Reviewed: Allergy & Precautions, NPO status , Patient's Chart, lab work & pertinent test results, reviewed documented beta blocker date and time   Airway Mallampati: II  TM Distance: >3 FB     Dental  (+) Chipped   Pulmonary           Cardiovascular      Neuro/Psych    GI/Hepatic   Endo/Other  diabetes  Renal/GU Renal InsufficiencyRenal disease     Musculoskeletal   Abdominal   Peds  Hematology   Anesthesia Other Findings   Reproductive/Obstetrics                             Anesthesia Physical Anesthesia Plan  ASA: III  Anesthesia Plan: General   Post-op Pain Management:    Induction: Intravenous  Airway Management Planned: Nasal Cannula  Additional Equipment:   Intra-op Plan:   Post-operative Plan:   Informed Consent: I have reviewed the patients History and Physical, chart, labs and discussed the procedure including the risks, benefits and alternatives for the proposed anesthesia with the patient or authorized representative who has indicated his/her understanding and acceptance.     Plan Discussed with: CRNA  Anesthesia Plan Comments:         Anesthesia Quick Evaluation

## 2016-03-28 NOTE — Op Note (Addendum)
[facility] Gastroenterology Patient Name: Nicholas Bentley Procedure Date: 03/28/2016 7:38 AM MRN: [id number] Account #: [id number] Date of Birth: 08-05-1966 Admit Type: Outpatient Age: 50 Room: [facility] ENDO ROOM 3 Gender: Male Note Status: Finalized Procedure:            Colonoscopy Indications:          Screening for colorectal malignant neoplasm, This is                        the patient's first colonoscopy Providers:            Lollie Sails, MD Referring MD:         Youlanda Roys. Lovie Macadamia, MD (Referring MD) Medicines:            Monitored Anesthesia Care Complications:        No immediate complications. Procedure:            Pre-Anesthesia Assessment:                       - After reviewing the risks and benefits, the patient                        was deemed in satisfactory condition to undergo the                        procedure.                       - ASA Grade Assessment: III - A patient with severe                        systemic disease.                       After obtaining informed consent, the colonoscope was                        passed under direct vision. Throughout the procedure,                        the patient's blood pressure, pulse, and oxygen                        saturations were monitored continuously. The                        Colonoscope was introduced through the anus and                        advanced to the the cecum, identified by appendiceal                        orifice and ileocecal valve. The colonoscopy was                        performed without difficulty. The patient tolerated the                        procedure well. The quality of the bowel preparation  was fair. Findings:      A few small-mouthed diverticula were found in the sigmoid colon and       descending colon.      A 2 mm polyp was found in the sigmoid colon mid sigmoid colon. The polyp       was sessile. The polyp was removed with  a cold biopsy forceps. Resection       and retrieval were complete.      Two sessile polyps were found in the rectum. The polyps were 1 to 2 mm       in size. These polyps were removed with a cold biopsy forceps. Resection       and retrieval were complete.      The exam was otherwise normal throughout the examined colon.      The digital rectal exam was normal. Impression:           - Preparation of the colon was fair.                       - Diverticulosis in the sigmoid colon and in the                        descending colon.                       - One 2 mm polyp in the sigmoid colon in the mid                        sigmoid colon, removed with a cold biopsy forceps.                        Resected and retrieved.                       - Two 1 to 2 mm polyps in the rectum, removed with a                        cold biopsy forceps. Resected and retrieved. Recommendation:       - Discharge patient to home.                       - Await pathology results.                       - Telephone GI clinic for pathology results in 1 week. Procedure Code(s):    --- Professional ---                       [phone], Colonoscopy, flexible; with biopsy, single or                        multiple Diagnosis Code(s):    --- Professional ---                       Z12.11, Encounter for screening for malignant neoplasm                        of colon                       D12.5, Benign neoplasm of sigmoid colon  K62.1, Rectal polyp                       K57.30, Diverticulosis of large intestine without                        perforation or abscess without bleeding CPT copyright 2016 American Medical Association. All rights reserved. The codes documented in this report are preliminary and upon coder review may  be revised to meet current compliance requirements. Lollie Sails, MD 03/28/2016 8:12:58 AM This report has been signed electronically. Number of Addenda: 0 Note Initiated  On: 03/28/2016 7:38 AM Scope Withdrawal Time: 0 hours 15 minutes 13 seconds  Total Procedure Duration: 0 hours 24 minutes 11 seconds       [facility]

## 2016-03-28 NOTE — Anesthesia Postprocedure Evaluation (Signed)
Anesthesia Post Note  Patient: [name], retail  Procedure(s) Performed: Procedure(s) (LRB): COLONOSCOPY WITH PROPOFOL (N/A)  Patient location during evaluation: Endoscopy Anesthesia Type: General Level of consciousness: awake and alert Pain management: pain level controlled Vital Signs Assessment: post-procedure vital signs reviewed and stable Respiratory status: spontaneous breathing, nonlabored ventilation, respiratory function stable and patient connected to nasal cannula oxygen Cardiovascular status: blood pressure returned to baseline and stable Postop Assessment: no signs of nausea or vomiting Anesthetic complications: no    Last Vitals:  Filed Vitals:   03/28/16 0830 03/28/16 0840  BP: 112/70 119/78  Pulse: 71 89  Temp:    Resp: 21 22    Last Pain: There were no vitals filed for this visit.               Tanzie Rothschild S

## 2016-03-29 ENCOUNTER — Encounter: Payer: Self-pay | Admitting: Gastroenterology

## 2016-03-29 LAB — SURGICAL PATHOLOGY

## 2016-09-22 IMAGING — CT CT RENAL STONE PROTOCOL
1 of 2 series · 15 of 32 positions shown, 19 images · non-contrast
Comparison: None.

CLINICAL DATA: Right flank pain, onset yesterday, radiating into
the groin. Nausea. History of kidney stones.

EXAM:
CT ABDOMEN AND PELVIS WITHOUT CONTRAST
TECHNIQUE: Multidetector CT imaging of the abdomen and pelvis was performed
following the standard protocol without IV contrast.

[Series 2: stone standard full · axial · 0.79mm/px · z∈[-993,-518]mm · 15 of 105 slices shown, 19 images]
[im 5/105  soft-tissue]
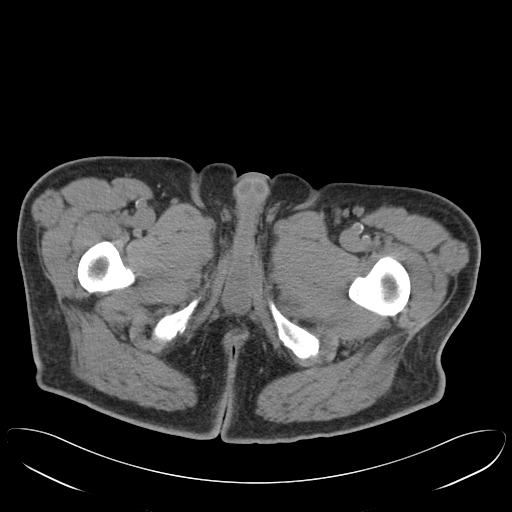
[im 5/105  bone]
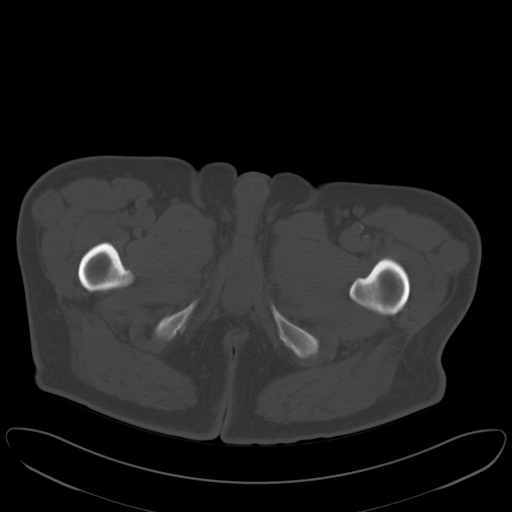
[im 14/105  soft-tissue]
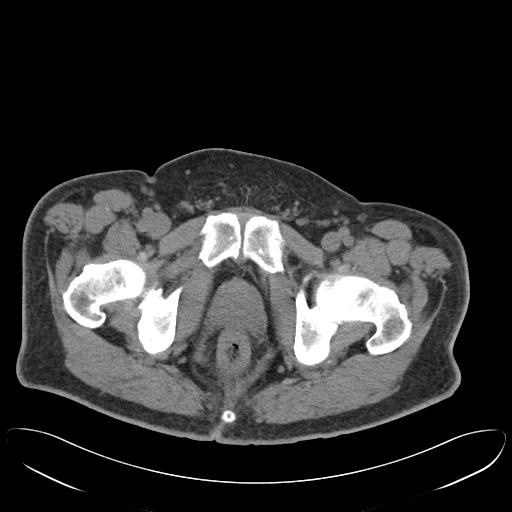
[im 22/105  soft-tissue]
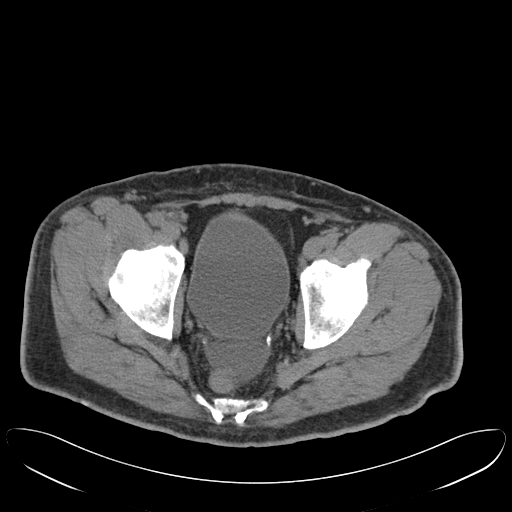
[im 31/105  soft-tissue]
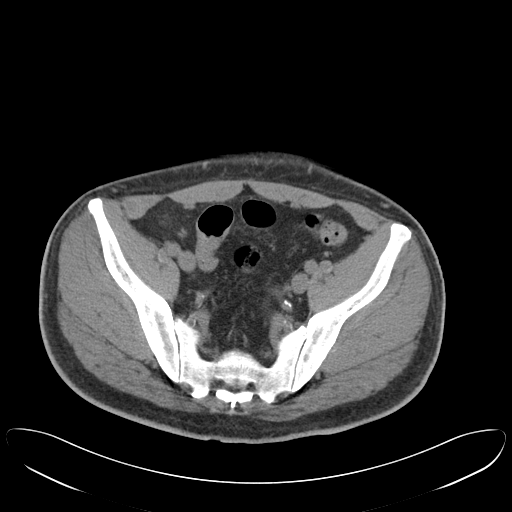
[im 35/105  soft-tissue]
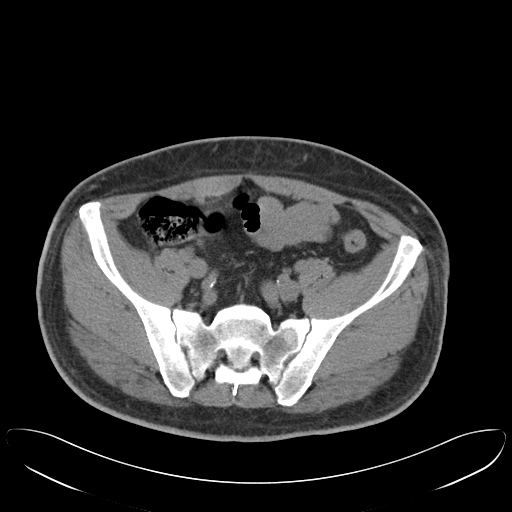
[im 44/105  soft-tissue]
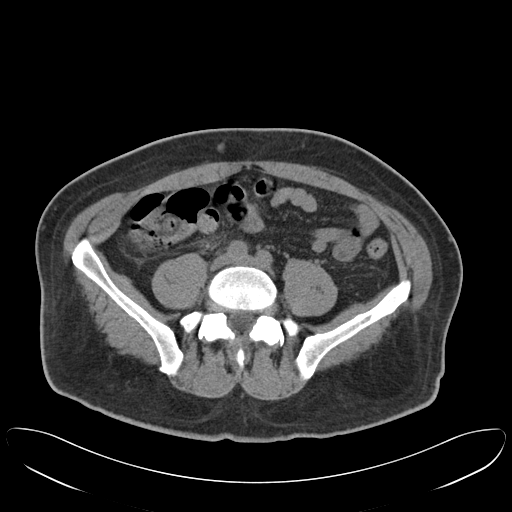
[im 53/105  soft-tissue]
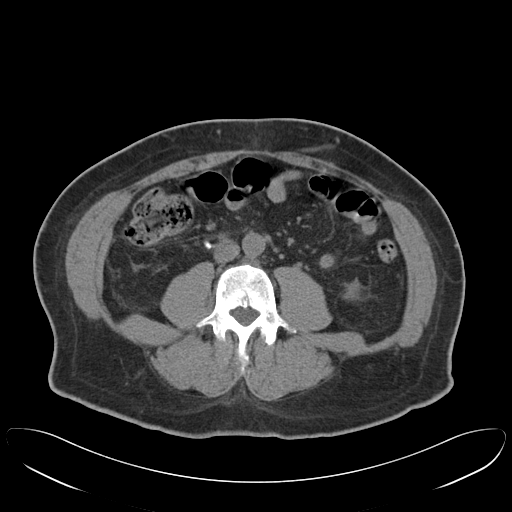
[im 61/105  soft-tissue]
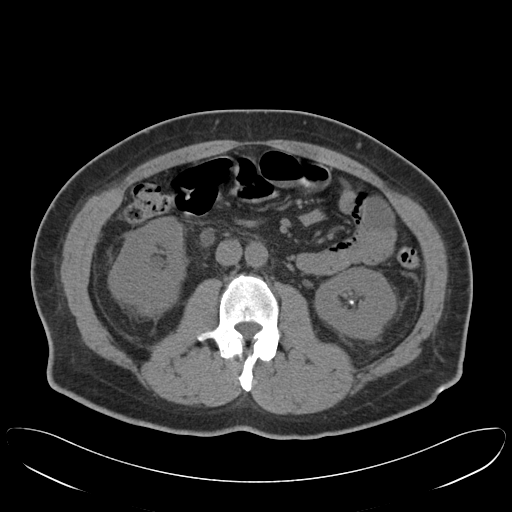
[im 70/105  soft-tissue]
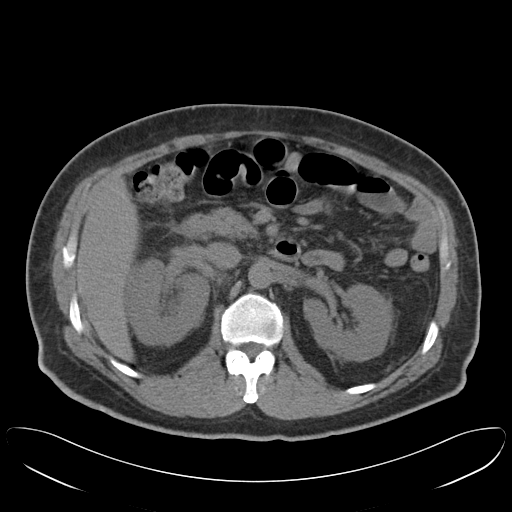
[im 70/105  bone]
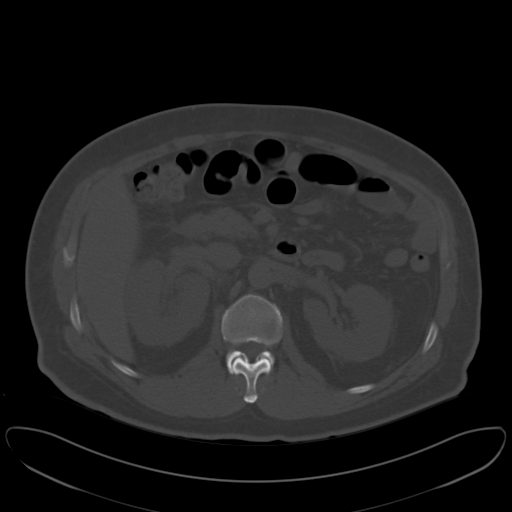
[im 74/105  soft-tissue]
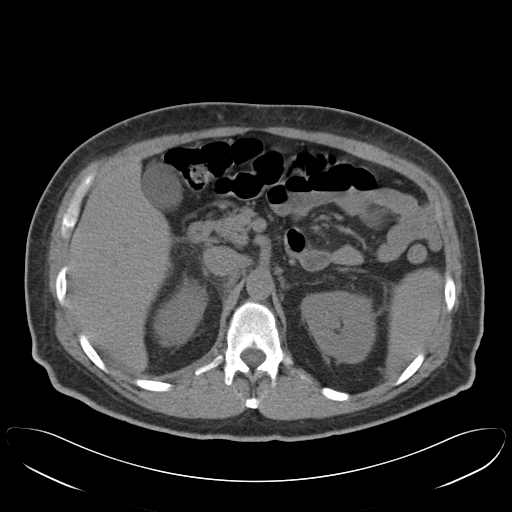
[im 83/105  soft-tissue]
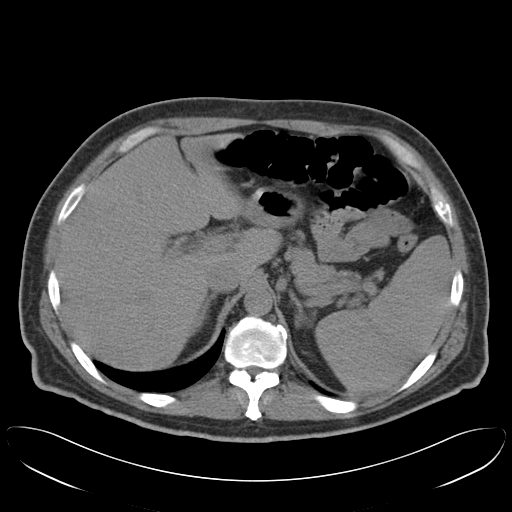
[im 87/105  lung]
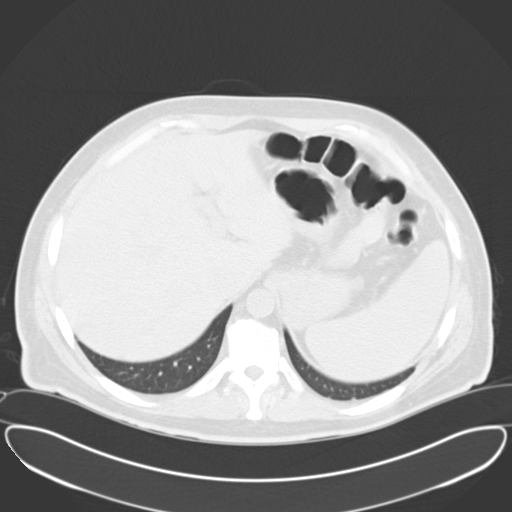
[im 92/105  soft-tissue]
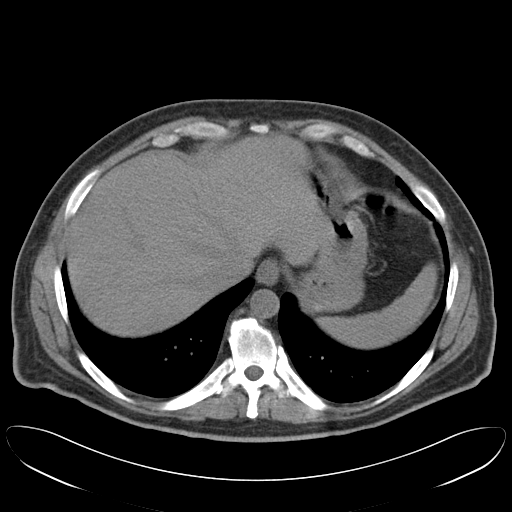
[im 92/105  lung]
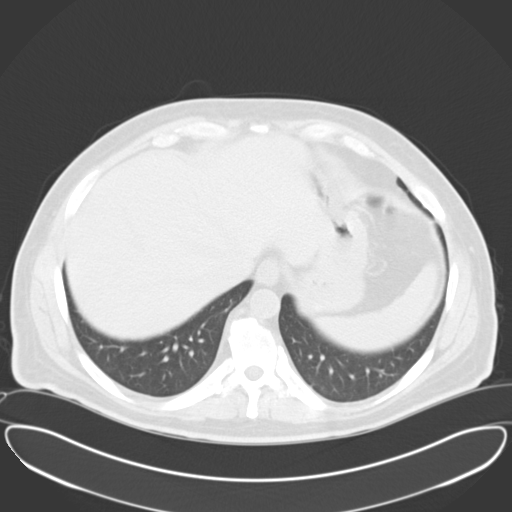
[im 96/105  lung]
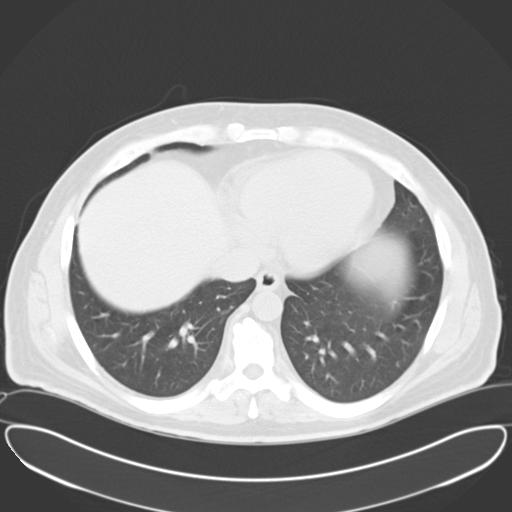
[im 100/105  soft-tissue]
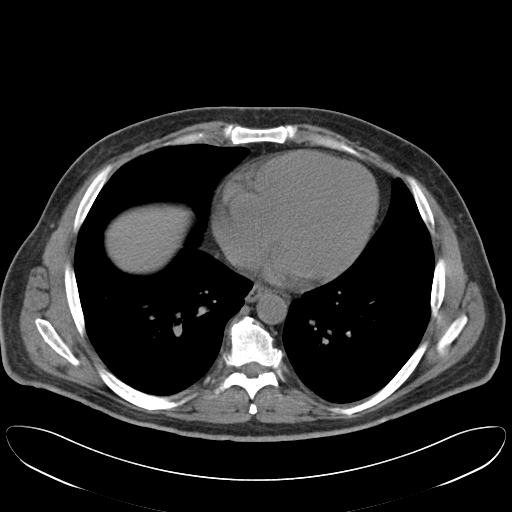
[im 100/105  lung]
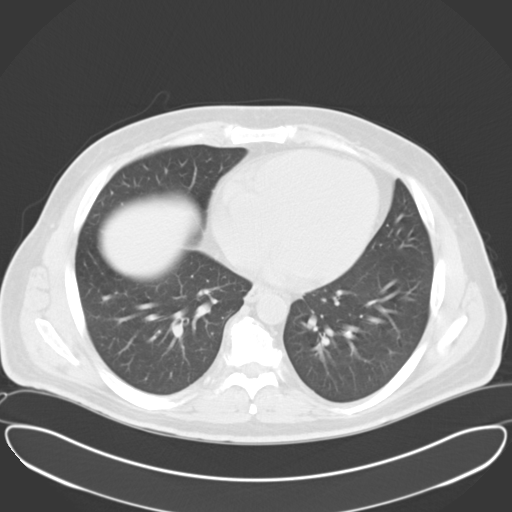

[15 of 32 positions shown; findings below may reference images not displayed]

FINDINGS: The included lung bases are clear.

There is a 6 x 7 mm stone in the right mid ureter with moderate
proximal hydroureteronephrosis and mild perinephric stranding. Stone
is at the level of L4. The ureter distal to this is decompressed.
There are no additional nonobstructing right renal stones. There are
2 nonobstructing stones in the lower left kidney. Mild prominence of
the left proximal ureter, the left distal ureter is decompressed. No
ureteral stones. There is mild nonspecific stranding about the left
kidney.

Evaluation of the remaining solid and hollow viscera is limited
given lack of contrast. The unenhanced liver, gallbladder, adrenal
glands, and pancreas are normal. Mild splenomegaly, spleen measures
14 cm in greatest dimension.

The stomach is decompressed. There are no dilated or thickened bowel
loops. Small to moderate volume of colonic stool. The appendix is
normal.

Mild mesenteric haziness. Small amount of fluid in the pelvis. This
measures simple fluid density.

No retroperitoneal adenopathy. Abdominal aorta is normal in caliber.
Mild atherosclerosis of the abdominal aorta without aneurysm.

Within the pelvis the bladder is physiologically distended without
stone. Prostate gland is prominent. Calcification of the vas
deferens. There is no pelvic adenopathy.

There are no acute or suspicious osseous abnormalities. There are
scattered bone islands.
IMPRESSION: 1. Obstructing 6 x 7 mm stone in the right mid ureter with resultant
hydroureteronephrosis.
2. Nonobstructing stones in the left kidney.
3. Incidental findings include mild splenomegaly. Small amount of
free fluid in the pelvis is likely reactive, however nonspecific.

## 2018-07-26 ENCOUNTER — Ambulatory Visit (INDEPENDENT_AMBULATORY_CARE_PROVIDER_SITE_OTHER): Payer: Federal, State, Local not specified - PPO | Admitting: Internal Medicine

## 2018-07-26 ENCOUNTER — Encounter: Payer: Self-pay | Admitting: Internal Medicine

## 2018-07-26 VITALS — BP 124/88 | HR 82 | Ht 70.0 in | Wt 219.8 lb

## 2018-07-26 DIAGNOSIS — E119 Type 2 diabetes mellitus without complications: Secondary | ICD-10-CM | POA: Diagnosis not present

## 2018-07-26 MED ORDER — METFORMIN HCL ER 750 MG PO TB24: 750.0000 mg | ORAL_TABLET | Freq: Two times a day (BID) | ORAL | 11 refills | Status: AC

## 2018-07-26 NOTE — Patient Instructions (Signed)
-   Continue to check glucose twice a day (preferrably fasting and at bedtime) - Continue with exercise and weight loss.

## 2018-07-26 NOTE — Progress Notes (Signed)
Name: Nicholas Bentley  MRN/ DOB: [id number], April 09, 1966   Age/ Sex: 52 y.o., male    PCP: Juluis Pitch, MD   Reason for Endocrinology Evaluation: Type 2 Diabetes Mellitus  Date of Initial Endocrinology Visit: 07/26/2018     PATIENT IDENTIFIER: Nicholas Bentley is a 52 y.o.  male with a past medical history of Nephrolithiasis and T2DM. The patient presented for initial ndocrinology clinic visit on 07/26/2018 for consultative assistance with his diabetes management per his wife's request.    HPI: Nicholas Bentley was diagnosed with Type 2 DM in 2015. He  initially had an A1c of 10.6% , he was started on Metformin at the time.Patient lost weight with lifestyle changes and was able to lower his A1c down to 5.0 % . Recently he has gained weight again and his A1c increased to 7.6 % on 06/12/2018.  His PCP had advised him to increased Metformin 750 mg XR gradually from once a day to three times a day but that caused severe diarrhea. Patient is currently taking Metformin BID.   He is back to working out and has lost 10lbs in the past month.  He denies any complaints. He has been checking glucose twice a day (fasting and during workout). He denies any hypoglycemia.   In terms of diet, the patient is on low CHO diet , he avoids all sugar-sweetened beverages.    HOME DIABETES REGIMEN: Metformin 750 mg BID  Statin: No ACE-I/ARB: Valsartan   GLUCOSE LOG:  Date Glucose (fasting)  07/26/18 155  10/16 163  10/15 170  10/14 179  10/13 154  10/12 156  10/11 173  10/10 173  10/7 163  10/6 138     DIABETIC COMPLICATIONS: Microvascular complications:   Microalbuminuria  Denies: Neuropathy, retinopathy  Last eye exam: Completed June, 2019  Macrovascular complications:    Denies: CAD, CVA, PVD   PAST HISTORY: Past Medical History:  Past Medical History:  Diagnosis Date  . Controlled diabetes mellitus type II without complication ([facility]) 7/98/9211  . Diabetes  mellitus without complication ([place])   . HTN (hypertension)   . Kidney stone   . Ureteral calculi 06/25/2015   Past Surgical History:  Past Surgical History:  Procedure Laterality Date  . COLONOSCOPY WITH PROPOFOL N/A 03/28/2016   Procedure: COLONOSCOPY WITH PROPOFOL;  Surgeon: Lollie Sails, MD;  Location: [facility] ENDOSCOPY;  Service: Endoscopy;  Laterality: N/A;  . CYSTOSCOPY W/ RETROGRADES  06/25/2015   Procedure: CYSTOSCOPY WITH RETROGRADE PYELOGRAM;  Surgeon: Cleon Gustin, MD;  Location: [facility] ORS;  Service: Urology;;  . Consuela Mimes W/ URETERAL STENT PLACEMENT Right 06/25/2015   Procedure: CYSTOSCOPY WITH STENT PLACEMENT;  Surgeon: Cleon Gustin, MD;  Location: [facility] ORS;  Service: Urology;  Laterality: Right;  . CYSTOSCOPY WITH STENT PLACEMENT Right 07/13/2015   Procedure: CYSTOSCOPY WITH STENT REMOVAL ;  Surgeon: Collier Flowers, MD;  Location: [facility] ORS;  Service: Urology;  Laterality: Right;  . URETEROSCOPY WITH HOLMIUM LASER LITHOTRIPSY Right 07/13/2015   Procedure: URETEROSCOPY WITH HOLMIUM LASER LITHOTRIPSY;  Surgeon: Collier Flowers, MD;  Location: [facility] ORS;  Service: Urology;  Laterality: Right;      Social History:  reports that he has never smoked. His smokeless tobacco use includes chew. He reports that he does not drink alcohol. His drug history is not on file. Family History:  Family History  Problem Relation Age of Onset  . Diabetes Father   . Diabetes Paternal Grandfather   .  Prostate cancer Neg Hx   . Bladder Cancer Neg Hx   . Renal cancer Neg Hx      HOME MEDICATIONS: Allergies as of 07/26/2018   No Known Allergies     Medication List        Accurate as of 07/26/18  5:06 PM. Always use your most recent med list.          acetaminophen 325 MG tablet Commonly known as:  TYLENOL Take 650 mg by mouth every 6 (six) hours as needed.   metFORMIN 750 MG 24 hr tablet Commonly known as:  GLUCOPHAGE-XR Take 1 tablet (750 mg total) by mouth 2 (two) times  daily.   ONE TOUCH ULTRA SYSTEM KIT w/Device Kit   ONE TOUCH ULTRA TEST test strip Generic drug:  glucose blood USE 2 (TWO) TIMES DAILY.   ONETOUCH DELICA LANCETS 54S Misc USE 1 EACH 2 (TWO) TIMES DAILY. TO CHECK BLOOD SUGARS DAILY   valsartan 80 MG tablet Commonly known as:  DIOVAN Take 80 mg by mouth daily.        ALLERGIES: No Known Allergies   REVIEW OF SYSTEMS: A comprehensive ROS was conducted with the patient and is negative except as per HPI and below:  Review of Systems  Constitutional: Negative.   HENT: Negative.   Eyes: Negative.   Respiratory: Negative.   Cardiovascular: Negative.   Gastrointestinal: Negative.   Genitourinary: Negative.   Musculoskeletal: Negative.   Skin: Negative.   Neurological: Negative.   Endo/Heme/Allergies: Negative.       OBJECTIVE:   VITAL SIGNS: BP 124/88 (BP Location: Right Arm, Patient Position: Sitting)   Pulse 82   Ht '5\' 10"'  (1.778 m)   Wt 99.7 kg   SpO2 96%   BMI 31.54 kg/m    PHYSICAL EXAM:  General: Pt appears well and is in NAD  Hydration: Well-hydrated with moist mucous membranes and good skin turgor  HEENT: Head: Unremarkable with good dentition. Oropharynx clear without exudate.  Eyes: External eye exam normal without stare, lid lag or exophthalmos.  EOM intact.  PERRL.  Neck: General: Supple without adenopathy or carotid bruits. Thyroid: Thyroid size normal.  No goiter or nodules appreciated. No thyroid bruit.  Lungs: Clear with good BS bilat with no rales, rhonchi, or wheezes  Heart: RRR with normal S1 and S2 and no gallops; no murmurs; no rub  Abdomen: Normoactive bowel sounds, soft, nontender, without masses or organomegaly palpable  Extremities:  Lower extremities - No pretibial edema. No lesions.  Skin: Normal texture and temperature to palpation. No rash noted. No Acanthosis nigricans/skin tags. No lipohypertrophy.  Neuro: MS is good with appropriate affect, pt is alert and Ox3       DM foot  exam: (07/26/2018) The skin of the feet is intact without sores or ulcerations. Patient has thickened discolored toe nail.  The pedal pulses are 2+ on right and 2+ on left. The sensation is intact to a screening 5.07, 10 gram monofilament bilaterally       DATA REVIEWED: A1c:(06/12/2018)  7.6 %    ASSESSMENT / PLAN / RECOMMENDATIONS:   1) Type 2 Diabetes Mellitus, sub-optimal controlled, with microalbuminuria  - Most recent A1c of 7.6 %. Goal A1c < 7.0 %.   Plan: GENERAL:  Praised patient on weight loss. He is motivated to continue with lifestyle changes  We discussed his current fasting glucose readings are about the goal of 80-130 mg/dL. I suggested we add a DPP-4 inhibitor to reduce his A1c  by an additional 0.4 -0.5 % but patient declined at this time and would like to give himself a chance in 3 months.   MEDICATIONS:  Continue Metformin 750 mg XR BID   EDUCATION / INSTRUCTIONS:  BG monitoring instructions: Patient is instructed to check his blood sugars 2 times a day,(Fasting and Bedtime).  Call [facility] if: BG persistently < 70 or > 300.    2) Diabetic complications:   Eye: Does not have known diabetic retinopathy. Last eye exam was less 1 year ago.   Neuro/ Feet: Does not have known diabetic neuropathy  Renal: Patient does not  have known baseline CKD.  He is  on an ACEI/ARB at present.   3) Lipids: Patient is not on a statin. I would recommend starting him on a statin given his high risk for CAD due to a diagnosis of A1c and age > 40 , regardless of his LDL levels( unless its already below 70 mg/dL) . Will defer lipid management to PCP.    F/U in 3 months     Signed electronically by: Mack Guise, MD  [facility]  [facility] [address] [place], [place] [address] Phone: [phone] FAX: [phone]   CC: Juluis Pitch, MD [address] [place] [address] Phone:  [phone]  Fax: [phone]    Return to Endocrinology clinic as below: Future Appointments  Date Time Provider [place]  10/24/2018  9:50 AM Shammleffer, Melanie Crazier, MD [id number] None

## 2018-10-24 ENCOUNTER — Ambulatory Visit: Payer: Federal, State, Local not specified - PPO | Admitting: Internal Medicine

## 2024-08-06 ENCOUNTER — Ambulatory Visit
